# Patient Record
Sex: Female | Born: 1935 | Race: White | Hispanic: No | Marital: Single | Smoking: Never smoker
Health system: Southern US, Community
[De-identification: ages and names within clinical notes are randomized; demographics above are authoritative.]

## PROBLEM LIST (undated history)

## (undated) DIAGNOSIS — I10 Essential (primary) hypertension: Secondary | ICD-10-CM

## (undated) DIAGNOSIS — G249 Dystonia, unspecified: Secondary | ICD-10-CM

## (undated) DIAGNOSIS — J189 Pneumonia, unspecified organism: Secondary | ICD-10-CM

---

## 1998-04-28 ENCOUNTER — Inpatient Hospital Stay (HOSPITAL_COMMUNITY): Admission: AD | Admit: 1998-04-28 | Discharge: 1998-05-08 | Payer: Self-pay | Admitting: *Deleted

## 1999-02-04 ENCOUNTER — Inpatient Hospital Stay (HOSPITAL_COMMUNITY): Admission: AD | Admit: 1999-02-04 | Discharge: 1999-02-09 | Payer: Self-pay | Admitting: Psychiatry

## 1999-03-04 ENCOUNTER — Inpatient Hospital Stay (HOSPITAL_COMMUNITY): Admission: AD | Admit: 1999-03-04 | Discharge: 1999-03-24 | Payer: Self-pay | Admitting: *Deleted

## 1999-07-16 ENCOUNTER — Other Ambulatory Visit: Admission: RE | Admit: 1999-07-16 | Discharge: 1999-07-16 | Payer: Self-pay | Admitting: Internal Medicine

## 1999-08-20 ENCOUNTER — Other Ambulatory Visit: Admission: RE | Admit: 1999-08-20 | Discharge: 1999-08-20 | Payer: Self-pay | Admitting: Obstetrics and Gynecology

## 1999-08-20 ENCOUNTER — Encounter (INDEPENDENT_AMBULATORY_CARE_PROVIDER_SITE_OTHER): Payer: Self-pay

## 1999-09-29 ENCOUNTER — Emergency Department (HOSPITAL_COMMUNITY): Admission: EM | Admit: 1999-09-29 | Discharge: 1999-09-30 | Payer: Self-pay

## 2000-07-31 ENCOUNTER — Inpatient Hospital Stay (HOSPITAL_COMMUNITY): Admission: EM | Admit: 2000-07-31 | Discharge: 2000-08-10 | Payer: Self-pay | Admitting: *Deleted

## 2001-07-11 ENCOUNTER — Emergency Department (HOSPITAL_COMMUNITY): Admission: EM | Admit: 2001-07-11 | Discharge: 2001-07-11 | Payer: Self-pay | Admitting: Emergency Medicine

## 2001-07-12 ENCOUNTER — Other Ambulatory Visit: Admission: RE | Admit: 2001-07-12 | Discharge: 2001-07-12 | Payer: Self-pay | Admitting: Obstetrics and Gynecology

## 2001-07-13 ENCOUNTER — Encounter: Payer: Self-pay | Admitting: Internal Medicine

## 2001-07-13 ENCOUNTER — Ambulatory Visit (HOSPITAL_COMMUNITY): Admission: RE | Admit: 2001-07-13 | Discharge: 2001-07-13 | Payer: Self-pay | Admitting: Internal Medicine

## 2001-07-27 ENCOUNTER — Ambulatory Visit (HOSPITAL_COMMUNITY): Admission: RE | Admit: 2001-07-27 | Discharge: 2001-07-27 | Payer: Self-pay | Admitting: Obstetrics and Gynecology

## 2001-07-27 ENCOUNTER — Encounter: Payer: Self-pay | Admitting: Obstetrics and Gynecology

## 2001-08-14 ENCOUNTER — Ambulatory Visit (HOSPITAL_COMMUNITY): Admission: RE | Admit: 2001-08-14 | Discharge: 2001-08-14 | Payer: Self-pay | Admitting: Obstetrics and Gynecology

## 2001-08-14 ENCOUNTER — Encounter (INDEPENDENT_AMBULATORY_CARE_PROVIDER_SITE_OTHER): Payer: Self-pay

## 2002-01-02 ENCOUNTER — Encounter (INDEPENDENT_AMBULATORY_CARE_PROVIDER_SITE_OTHER): Payer: Self-pay

## 2002-01-02 ENCOUNTER — Encounter (INDEPENDENT_AMBULATORY_CARE_PROVIDER_SITE_OTHER): Payer: Self-pay | Admitting: *Deleted

## 2002-01-02 ENCOUNTER — Inpatient Hospital Stay (HOSPITAL_COMMUNITY): Admission: RE | Admit: 2002-01-02 | Discharge: 2002-01-07 | Payer: Self-pay | Admitting: Obstetrics and Gynecology

## 2002-01-04 ENCOUNTER — Encounter: Payer: Self-pay | Admitting: Obstetrics and Gynecology

## 2002-01-21 ENCOUNTER — Encounter: Payer: Self-pay | Admitting: Internal Medicine

## 2002-01-22 ENCOUNTER — Ambulatory Visit (HOSPITAL_COMMUNITY): Admission: RE | Admit: 2002-01-22 | Discharge: 2002-01-22 | Payer: Self-pay | Admitting: Obstetrics and Gynecology

## 2002-01-22 ENCOUNTER — Encounter: Payer: Self-pay | Admitting: Obstetrics and Gynecology

## 2002-05-23 ENCOUNTER — Encounter: Payer: Self-pay | Admitting: Emergency Medicine

## 2002-05-24 ENCOUNTER — Inpatient Hospital Stay (HOSPITAL_COMMUNITY): Admission: EM | Admit: 2002-05-24 | Discharge: 2002-06-04 | Payer: Self-pay | Admitting: Emergency Medicine

## 2002-05-25 ENCOUNTER — Encounter: Payer: Self-pay | Admitting: Pediatrics

## 2002-05-29 ENCOUNTER — Encounter: Payer: Self-pay | Admitting: Internal Medicine

## 2002-05-30 ENCOUNTER — Encounter: Payer: Self-pay | Admitting: Hematology & Oncology

## 2004-01-26 ENCOUNTER — Ambulatory Visit: Payer: Self-pay | Admitting: Internal Medicine

## 2004-01-29 ENCOUNTER — Ambulatory Visit: Payer: Self-pay | Admitting: Hematology & Oncology

## 2004-01-29 ENCOUNTER — Ambulatory Visit: Payer: Self-pay | Admitting: Gastroenterology

## 2004-01-30 ENCOUNTER — Encounter: Admission: RE | Admit: 2004-01-30 | Discharge: 2004-01-30 | Payer: Self-pay | Admitting: Gastroenterology

## 2004-02-09 ENCOUNTER — Encounter: Payer: Self-pay | Admitting: Internal Medicine

## 2004-02-09 ENCOUNTER — Ambulatory Visit (HOSPITAL_COMMUNITY): Admission: RE | Admit: 2004-02-09 | Discharge: 2004-02-09 | Payer: Self-pay | Admitting: Gastroenterology

## 2004-02-09 ENCOUNTER — Ambulatory Visit: Payer: Self-pay | Admitting: Gastroenterology

## 2004-02-09 ENCOUNTER — Encounter (INDEPENDENT_AMBULATORY_CARE_PROVIDER_SITE_OTHER): Payer: Self-pay | Admitting: Specialist

## 2004-02-17 ENCOUNTER — Ambulatory Visit: Payer: Self-pay | Admitting: Internal Medicine

## 2004-03-23 ENCOUNTER — Ambulatory Visit: Payer: Self-pay | Admitting: Internal Medicine

## 2004-05-11 ENCOUNTER — Ambulatory Visit: Payer: Self-pay | Admitting: Internal Medicine

## 2004-05-21 ENCOUNTER — Ambulatory Visit: Payer: Self-pay

## 2004-06-03 ENCOUNTER — Ambulatory Visit: Payer: Self-pay | Admitting: Internal Medicine

## 2004-06-03 ENCOUNTER — Ambulatory Visit: Payer: Self-pay | Admitting: Hematology & Oncology

## 2004-06-13 ENCOUNTER — Ambulatory Visit: Payer: Self-pay | Admitting: Internal Medicine

## 2004-06-13 ENCOUNTER — Inpatient Hospital Stay (HOSPITAL_COMMUNITY): Admission: EM | Admit: 2004-06-13 | Discharge: 2004-06-21 | Payer: Self-pay | Admitting: Emergency Medicine

## 2004-06-13 ENCOUNTER — Ambulatory Visit: Payer: Self-pay | Admitting: Physical Medicine & Rehabilitation

## 2004-06-16 ENCOUNTER — Encounter (INDEPENDENT_AMBULATORY_CARE_PROVIDER_SITE_OTHER): Payer: Self-pay | Admitting: Specialist

## 2004-07-16 ENCOUNTER — Ambulatory Visit: Payer: Self-pay | Admitting: Internal Medicine

## 2004-09-03 ENCOUNTER — Ambulatory Visit: Payer: Self-pay | Admitting: Internal Medicine

## 2004-09-30 ENCOUNTER — Ambulatory Visit: Payer: Self-pay | Admitting: Hematology & Oncology

## 2004-10-08 ENCOUNTER — Ambulatory Visit: Payer: Self-pay | Admitting: Internal Medicine

## 2004-10-28 ENCOUNTER — Ambulatory Visit: Payer: Self-pay | Admitting: Internal Medicine

## 2004-11-12 ENCOUNTER — Encounter: Admission: RE | Admit: 2004-11-12 | Discharge: 2004-11-12 | Payer: Self-pay | Admitting: Neurology

## 2005-01-10 ENCOUNTER — Ambulatory Visit: Payer: Self-pay | Admitting: Internal Medicine

## 2005-02-07 ENCOUNTER — Encounter: Payer: Self-pay | Admitting: Internal Medicine

## 2005-04-11 ENCOUNTER — Ambulatory Visit (HOSPITAL_COMMUNITY): Admission: RE | Admit: 2005-04-11 | Discharge: 2005-04-11 | Payer: Self-pay | Admitting: Anesthesiology

## 2005-04-12 ENCOUNTER — Ambulatory Visit: Payer: Self-pay | Admitting: Internal Medicine

## 2005-04-26 ENCOUNTER — Ambulatory Visit: Payer: Self-pay | Admitting: Internal Medicine

## 2005-06-22 ENCOUNTER — Emergency Department (HOSPITAL_COMMUNITY): Admission: EM | Admit: 2005-06-22 | Discharge: 2005-06-22 | Payer: Self-pay | Admitting: *Deleted

## 2005-08-10 ENCOUNTER — Ambulatory Visit: Payer: Self-pay | Admitting: Internal Medicine

## 2005-09-12 ENCOUNTER — Ambulatory Visit: Payer: Self-pay | Admitting: Internal Medicine

## 2005-11-22 IMAGING — CT CT EXTREM UP W/O CM*R*
2 of 4 series · 6 of 14 positions shown, 7 images · IV contrast (agent unspecified)
Comparison: none

CLINICAL DATA: 73-year-old with shoulder pain; questionable fracture on plain films
 CT RIGHT SHOULDER WITHOUT CONTRAST:
 High resolution thin slice helical CT was performed in the axial plane with coronal and sagittal reformatted images.  
 I do not see a definite humeral head or neck fracture.  There is osteophytic spurring involving the humeral head which I think accounts for the plain film findings.  There is also spurring along the bicipital groove with what may be an old avulsion injury or some heterotopic bone formation near the posterior aspect of the bicipital groove.  There is also evidence of calcific tendinitis involving the rotator cuff tendons.  There is degenerative change involving the AC joint.

[Series 2: shoulder · axial · 0.47mm/px · z∈[-118,-76]mm · 2 of 99 slices shown (1 of 2)]
[im 33/99  bone]
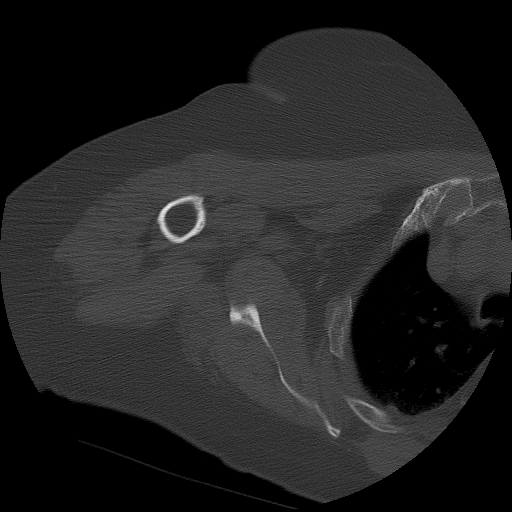
[im 66/99  bone]
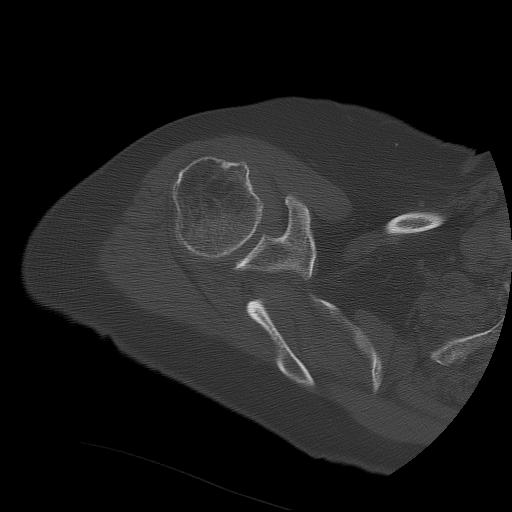

[Series 102: shoulder · axial · 0.47mm/px · z∈[-133,-60]mm · 4 of 205 slices shown, 5 images (2 of 2)]
[im 41/205  soft-tissue]
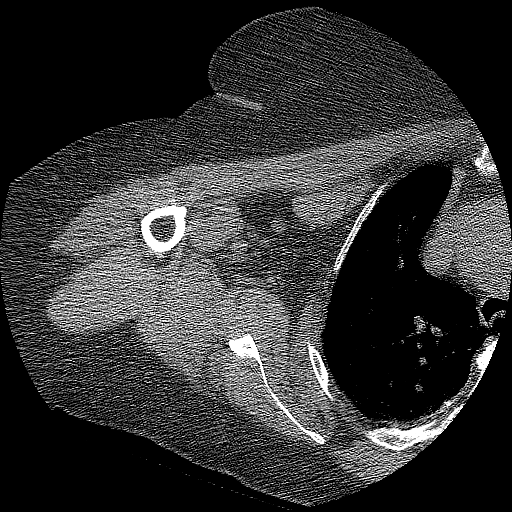
[im 41/205  bone]
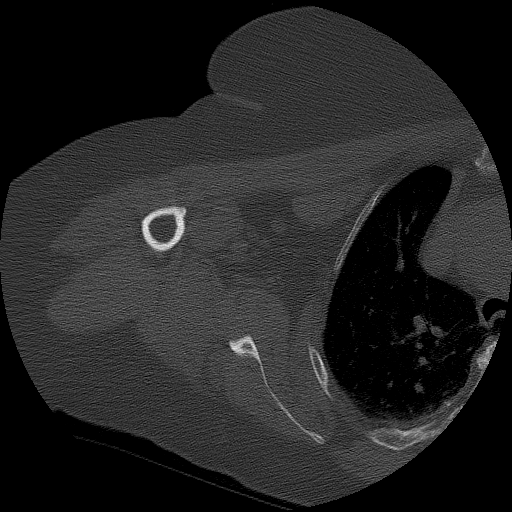
[im 82/205  bone]
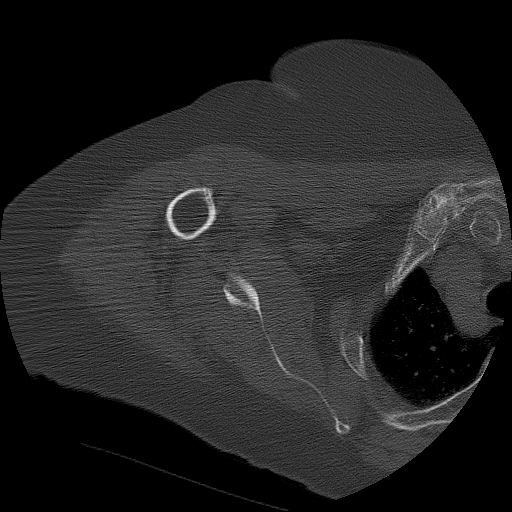
[im 123/205  bone]
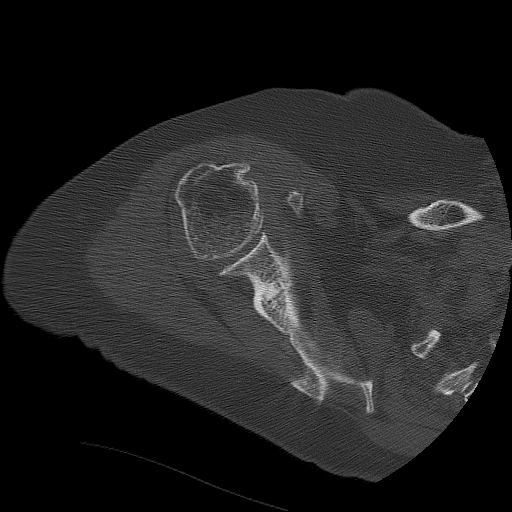
[im 164/205  bone]
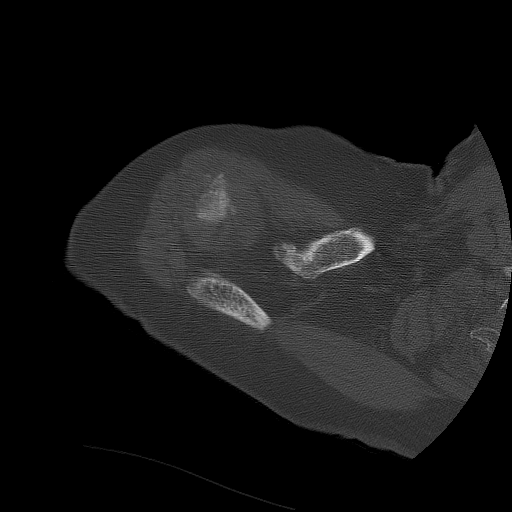

[6 of 14 positions shown; findings below may reference images not displayed]

IMPRESSION: 1.  No CT findings to suggest a humeral head or neck fracture.  
 2.  Glenohumeral joint degenerative changes.  There is also evidence for calcific tendinitis.

## 2006-02-12 ENCOUNTER — Inpatient Hospital Stay (HOSPITAL_COMMUNITY): Admission: EM | Admit: 2006-02-12 | Discharge: 2006-02-15 | Payer: Self-pay | Admitting: Emergency Medicine

## 2006-02-13 ENCOUNTER — Ambulatory Visit: Payer: Self-pay | Admitting: Internal Medicine

## 2006-03-31 ENCOUNTER — Ambulatory Visit: Payer: Self-pay | Admitting: Internal Medicine

## 2006-05-03 ENCOUNTER — Ambulatory Visit: Payer: Self-pay | Admitting: Internal Medicine

## 2006-05-03 LAB — CONVERTED CEMR LAB
ALT: 20 units/L (ref 0–40)
AST: 23 units/L (ref 0–37)
Albumin: 3.6 g/dL (ref 3.5–5.2)
Alkaline Phosphatase: 106 units/L (ref 39–117)
BUN: 16 mg/dL (ref 6–23)
Bilirubin, Direct: 0.1 mg/dL (ref 0.0–0.3)
CO2: 28 meq/L (ref 19–32)
Calcium: 9.1 mg/dL (ref 8.4–10.5)
Chloride: 104 meq/L (ref 96–112)
Creatinine, Ser: 0.6 mg/dL (ref 0.4–1.2)
GFR calc Af Amer: 126 mL/min
GFR calc non Af Amer: 104 mL/min
Glucose, Bld: 77 mg/dL (ref 70–99)
Potassium: 4.3 meq/L (ref 3.5–5.1)
Sodium: 140 meq/L (ref 135–145)
Total Bilirubin: 0.7 mg/dL (ref 0.3–1.2)
Total Protein: 8.5 g/dL — ABNORMAL HIGH (ref 6.0–8.3)

## 2006-05-04 ENCOUNTER — Encounter: Payer: Self-pay | Admitting: Internal Medicine

## 2006-05-04 LAB — CONVERTED CEMR LAB
IgA: 448 mg/dL — ABNORMAL HIGH (ref 68–378)
IgG (Immunoglobin G), Serum: 3280 mg/dL — ABNORMAL HIGH (ref 694–1618)
IgM, Serum: 89 mg/dL (ref 60–263)
Total Protein, Serum Electrophoresis: 9 g/dL — ABNORMAL HIGH (ref 6.0–8.3)

## 2006-05-17 ENCOUNTER — Ambulatory Visit: Payer: Self-pay | Admitting: Internal Medicine

## 2006-05-24 ENCOUNTER — Ambulatory Visit: Payer: Self-pay | Admitting: Internal Medicine

## 2006-05-26 ENCOUNTER — Ambulatory Visit: Payer: Self-pay | Admitting: Internal Medicine

## 2006-09-21 DIAGNOSIS — I10 Essential (primary) hypertension: Secondary | ICD-10-CM | POA: Insufficient documentation

## 2006-09-26 ENCOUNTER — Inpatient Hospital Stay (HOSPITAL_COMMUNITY): Admission: EM | Admit: 2006-09-26 | Discharge: 2006-10-02 | Payer: Self-pay | Admitting: Emergency Medicine

## 2006-09-27 ENCOUNTER — Ambulatory Visit: Payer: Self-pay | Admitting: Internal Medicine

## 2006-09-30 ENCOUNTER — Encounter: Payer: Self-pay | Admitting: Internal Medicine

## 2006-09-30 ENCOUNTER — Ambulatory Visit: Payer: Self-pay | Admitting: Vascular Surgery

## 2006-11-03 ENCOUNTER — Ambulatory Visit: Payer: Self-pay | Admitting: Internal Medicine

## 2006-11-03 DIAGNOSIS — R319 Hematuria, unspecified: Secondary | ICD-10-CM | POA: Insufficient documentation

## 2006-11-03 DIAGNOSIS — D472 Monoclonal gammopathy: Secondary | ICD-10-CM | POA: Insufficient documentation

## 2006-11-03 DIAGNOSIS — IMO0002 Reserved for concepts with insufficient information to code with codable children: Secondary | ICD-10-CM

## 2006-11-03 DIAGNOSIS — R259 Unspecified abnormal involuntary movements: Secondary | ICD-10-CM | POA: Insufficient documentation

## 2007-01-29 ENCOUNTER — Telehealth: Payer: Self-pay | Admitting: Internal Medicine

## 2007-02-02 ENCOUNTER — Ambulatory Visit: Payer: Self-pay | Admitting: Internal Medicine

## 2007-02-16 ENCOUNTER — Emergency Department (HOSPITAL_COMMUNITY): Admission: EM | Admit: 2007-02-16 | Discharge: 2007-02-16 | Payer: Self-pay | Admitting: Emergency Medicine

## 2007-03-07 ENCOUNTER — Ambulatory Visit: Payer: Self-pay | Admitting: Internal Medicine

## 2007-03-25 ENCOUNTER — Ambulatory Visit: Payer: Self-pay | Admitting: Internal Medicine

## 2007-04-04 ENCOUNTER — Telehealth: Payer: Self-pay | Admitting: Internal Medicine

## 2007-04-06 ENCOUNTER — Ambulatory Visit: Payer: Self-pay | Admitting: Internal Medicine

## 2007-04-06 LAB — CONVERTED CEMR LAB
Nitrite: POSITIVE
pH: 5.5

## 2007-04-19 ENCOUNTER — Telehealth (INDEPENDENT_AMBULATORY_CARE_PROVIDER_SITE_OTHER): Payer: Self-pay | Admitting: *Deleted

## 2007-04-22 ENCOUNTER — Encounter: Payer: Self-pay | Admitting: Internal Medicine

## 2007-04-30 ENCOUNTER — Inpatient Hospital Stay (HOSPITAL_COMMUNITY): Admission: EM | Admit: 2007-04-30 | Discharge: 2007-05-03 | Payer: Self-pay | Admitting: Emergency Medicine

## 2007-04-30 ENCOUNTER — Ambulatory Visit: Payer: Self-pay | Admitting: Cardiovascular Disease

## 2007-05-01 ENCOUNTER — Ambulatory Visit: Payer: Self-pay | Admitting: Internal Medicine

## 2007-05-02 ENCOUNTER — Encounter: Payer: Self-pay | Admitting: Internal Medicine

## 2007-05-03 ENCOUNTER — Encounter: Payer: Self-pay | Admitting: Internal Medicine

## 2007-06-28 ENCOUNTER — Encounter: Payer: Self-pay | Admitting: Internal Medicine

## 2007-07-10 ENCOUNTER — Ambulatory Visit: Payer: Self-pay | Admitting: Internal Medicine

## 2007-08-02 ENCOUNTER — Emergency Department (HOSPITAL_COMMUNITY): Admission: EM | Admit: 2007-08-02 | Discharge: 2007-08-03 | Payer: Self-pay | Admitting: Emergency Medicine

## 2007-08-02 ENCOUNTER — Encounter: Payer: Self-pay | Admitting: Internal Medicine

## 2007-08-10 ENCOUNTER — Encounter: Payer: Self-pay | Admitting: Internal Medicine

## 2007-08-22 ENCOUNTER — Telehealth: Payer: Self-pay | Admitting: Internal Medicine

## 2007-08-28 ENCOUNTER — Emergency Department (HOSPITAL_COMMUNITY): Admission: EM | Admit: 2007-08-28 | Discharge: 2007-08-28 | Payer: Self-pay | Admitting: Emergency Medicine

## 2007-09-10 ENCOUNTER — Ambulatory Visit: Payer: Self-pay | Admitting: Internal Medicine

## 2007-09-12 ENCOUNTER — Telehealth: Payer: Self-pay | Admitting: *Deleted

## 2007-09-12 LAB — CONVERTED CEMR LAB
AST: 21 units/L (ref 0–37)
Alkaline Phosphatase: 101 units/L (ref 39–117)
BUN: 10 mg/dL (ref 6–23)
Basophils Absolute: 0 10*3/uL (ref 0.0–0.1)
Bilirubin, Direct: 0.1 mg/dL (ref 0.0–0.3)
Blood in Urine, dipstick: NEGATIVE
Chloride: 105 meq/L (ref 96–112)
Eosinophils Absolute: 0.1 10*3/uL (ref 0.0–0.7)
Eosinophils Relative: 1.8 % (ref 0.0–5.0)
GFR calc non Af Amer: 75 mL/min
HDL: 38.7 mg/dL — ABNORMAL LOW (ref >39.0)
Ketones, urine, test strip: NEGATIVE
MCV: 98.1 fL (ref 78.0–100.0)
Neutrophils Relative %: 53.2 % (ref 43.0–77.0)
Nitrite: POSITIVE
Platelets: 254 10*3/uL (ref 150–400)
Potassium: 3.8 meq/L (ref 3.5–5.1)
Protein, U semiquant: NEGATIVE
Sodium: 139 meq/L (ref 135–145)
Total Bilirubin: 0.8 mg/dL (ref 0.3–1.2)
Total CHOL/HDL Ratio: 4.7
Urobilinogen, UA: 0.2
VLDL: 21 mg/dL (ref 0–40)
WBC: 4.6 10*3/uL (ref 4.5–10.5)

## 2007-10-19 ENCOUNTER — Encounter: Payer: Self-pay | Admitting: Internal Medicine

## 2007-10-22 ENCOUNTER — Telehealth: Payer: Self-pay | Admitting: Internal Medicine

## 2007-10-31 ENCOUNTER — Telehealth: Payer: Self-pay | Admitting: Internal Medicine

## 2007-11-09 ENCOUNTER — Telehealth: Payer: Self-pay | Admitting: Internal Medicine

## 2007-11-13 ENCOUNTER — Telehealth: Payer: Self-pay | Admitting: Internal Medicine

## 2007-11-22 ENCOUNTER — Encounter: Payer: Self-pay | Admitting: Internal Medicine

## 2007-11-28 ENCOUNTER — Telehealth: Payer: Self-pay | Admitting: Internal Medicine

## 2007-11-29 ENCOUNTER — Encounter: Payer: Self-pay | Admitting: Internal Medicine

## 2007-12-04 ENCOUNTER — Telehealth: Payer: Self-pay | Admitting: Internal Medicine

## 2007-12-04 ENCOUNTER — Encounter: Payer: Self-pay | Admitting: Internal Medicine

## 2007-12-05 ENCOUNTER — Emergency Department (HOSPITAL_COMMUNITY): Admission: EM | Admit: 2007-12-05 | Discharge: 2007-12-06 | Payer: Self-pay | Admitting: Emergency Medicine

## 2007-12-13 ENCOUNTER — Telehealth: Payer: Self-pay | Admitting: Internal Medicine

## 2008-01-25 ENCOUNTER — Encounter: Payer: Self-pay | Admitting: Internal Medicine

## 2008-01-28 ENCOUNTER — Encounter: Payer: Self-pay | Admitting: Internal Medicine

## 2008-02-01 ENCOUNTER — Encounter: Payer: Self-pay | Admitting: Internal Medicine

## 2008-02-04 ENCOUNTER — Telehealth: Payer: Self-pay | Admitting: Internal Medicine

## 2008-02-18 ENCOUNTER — Encounter: Payer: Self-pay | Admitting: Internal Medicine

## 2008-02-21 ENCOUNTER — Ambulatory Visit: Payer: Self-pay | Admitting: Internal Medicine

## 2008-03-11 ENCOUNTER — Ambulatory Visit: Payer: Self-pay | Admitting: Internal Medicine

## 2008-03-13 LAB — CONVERTED CEMR LAB
BUN: 16 mg/dL (ref 6–23)
Chloride: 104 meq/L (ref 96–112)
Glucose, Bld: 97 mg/dL (ref 70–99)
Potassium: 4 meq/L (ref 3.5–5.1)
Sodium: 138 meq/L (ref 135–145)

## 2008-03-17 ENCOUNTER — Encounter: Payer: Self-pay | Admitting: Internal Medicine

## 2008-04-11 ENCOUNTER — Encounter: Payer: Self-pay | Admitting: Internal Medicine

## 2008-04-17 ENCOUNTER — Encounter: Payer: Self-pay | Admitting: Internal Medicine

## 2008-05-18 ENCOUNTER — Inpatient Hospital Stay (HOSPITAL_COMMUNITY): Admission: EM | Admit: 2008-05-18 | Discharge: 2008-05-23 | Payer: Self-pay | Admitting: Emergency Medicine

## 2008-05-18 ENCOUNTER — Ambulatory Visit: Payer: Self-pay | Admitting: Internal Medicine

## 2008-05-18 ENCOUNTER — Encounter: Payer: Self-pay | Admitting: Internal Medicine

## 2008-07-09 ENCOUNTER — Ambulatory Visit: Payer: Self-pay | Admitting: Internal Medicine

## 2008-07-10 ENCOUNTER — Ambulatory Visit: Payer: Self-pay | Admitting: Internal Medicine

## 2008-07-10 DIAGNOSIS — M549 Dorsalgia, unspecified: Secondary | ICD-10-CM | POA: Insufficient documentation

## 2008-07-11 ENCOUNTER — Telehealth (INDEPENDENT_AMBULATORY_CARE_PROVIDER_SITE_OTHER): Payer: Self-pay | Admitting: *Deleted

## 2008-07-16 ENCOUNTER — Encounter: Admission: RE | Admit: 2008-07-16 | Discharge: 2008-07-16 | Payer: Self-pay | Admitting: Internal Medicine

## 2008-07-30 ENCOUNTER — Encounter: Payer: Self-pay | Admitting: Internal Medicine

## 2008-08-05 ENCOUNTER — Inpatient Hospital Stay (HOSPITAL_COMMUNITY): Admission: EM | Admit: 2008-08-05 | Discharge: 2008-08-12 | Payer: Self-pay | Admitting: Emergency Medicine

## 2008-08-05 ENCOUNTER — Ambulatory Visit: Payer: Self-pay | Admitting: Internal Medicine

## 2008-08-07 ENCOUNTER — Ambulatory Visit: Payer: Self-pay | Admitting: Physical Medicine & Rehabilitation

## 2008-09-07 ENCOUNTER — Ambulatory Visit: Payer: Self-pay | Admitting: Internal Medicine

## 2008-09-15 ENCOUNTER — Encounter: Payer: Self-pay | Admitting: Internal Medicine

## 2008-09-17 ENCOUNTER — Encounter: Payer: Self-pay | Admitting: Internal Medicine

## 2008-09-23 ENCOUNTER — Encounter: Payer: Self-pay | Admitting: Internal Medicine

## 2008-09-26 ENCOUNTER — Encounter: Payer: Self-pay | Admitting: Internal Medicine

## 2008-10-03 ENCOUNTER — Ambulatory Visit: Payer: Self-pay | Admitting: Internal Medicine

## 2008-10-15 ENCOUNTER — Emergency Department (HOSPITAL_COMMUNITY): Admission: EM | Admit: 2008-10-15 | Discharge: 2008-10-16 | Payer: Self-pay | Admitting: Emergency Medicine

## 2008-12-26 ENCOUNTER — Encounter (INDEPENDENT_AMBULATORY_CARE_PROVIDER_SITE_OTHER): Payer: Self-pay | Admitting: *Deleted

## 2009-04-04 ENCOUNTER — Encounter: Admission: RE | Admit: 2009-04-04 | Discharge: 2009-04-04 | Payer: Self-pay | Admitting: Internal Medicine

## 2009-08-06 ENCOUNTER — Encounter: Payer: Self-pay | Admitting: Internal Medicine

## 2009-09-01 ENCOUNTER — Telehealth: Payer: Self-pay | Admitting: Gastroenterology

## 2009-10-28 ENCOUNTER — Encounter: Payer: Self-pay | Admitting: Internal Medicine

## 2009-10-31 ENCOUNTER — Encounter: Payer: Self-pay | Admitting: Gastroenterology

## 2009-11-26 ENCOUNTER — Encounter: Payer: Self-pay | Admitting: Gastroenterology

## 2010-01-18 ENCOUNTER — Encounter (INDEPENDENT_AMBULATORY_CARE_PROVIDER_SITE_OTHER): Payer: Self-pay | Admitting: *Deleted

## 2010-03-04 ENCOUNTER — Ambulatory Visit: Payer: Self-pay | Admitting: Gastroenterology

## 2010-03-04 DIAGNOSIS — Z860100 Personal history of colon polyps, unspecified: Secondary | ICD-10-CM | POA: Insufficient documentation

## 2010-03-04 DIAGNOSIS — R142 Eructation: Secondary | ICD-10-CM

## 2010-03-04 DIAGNOSIS — R141 Gas pain: Secondary | ICD-10-CM | POA: Insufficient documentation

## 2010-03-04 DIAGNOSIS — R143 Flatulence: Secondary | ICD-10-CM

## 2010-04-20 NOTE — Progress Notes (Signed)
Summary: Schedule Colonoscopy   Phone Note Outgoing Call Call back at [facility] Phone [phone]   Call placed by: Harlow Mares CMA Duncan Dull),  September 01, 2009 9:47 AM Call placed to: Patient Summary of Call: No Answer, pt is due for colonoscopy.  Initial call taken by: Harlow Mares CMA Duncan Dull),  September 01, 2009 9:47 AM  Follow-up for Phone Call        Left a message on the patient machine to call back and schedule a previsit and procedure with our office. A letter will be mailed to the patient.   Follow-up by: Harlow Mares CMA Duncan Dull),  September 17, 2009 11:30 AM     Appended Document: Schedule Colonoscopy Coy Saunas, NP @ [facility] returned your call. Pt is in a nursing facility and does not think she could tolerate the prep for a colonoscopy.  # [id number]  I called Margo, NP back and explained that the patiecnts colonoscopy was a recall colonoscopy for hx of ADENOMATOUS POLYP(S, she states that since the colonoscopy was for a recall the patient needs to have the colonoscopy done she was under the impression from the pt that the colonoscopy was just a sceening, not a recall. She states that the pt is getting over pneumonia at this time but she would like for me to call her back in one month and see how she is doing to schedule the colonoscopy. She advised me the patients POA is her son Evangaline Jou, and she will let him know what is going on with his mother. I told her that when the pt is well we will schedule her for an office visit with Dr. Russella Dar since her medical situation has changed since he last seen her, but her POA must come with her to her office visit and any other visits with our office. She will advise the family and I will call them back in a little over a month to make arrangements for an office visit.   Appended Document: Schedule Colonoscopy     Phone Note Outgoing Call Call back at Home Phone [phone]   Call placed by: Harlow Mares CMA Duncan Dull),  November 06, 2009 11:08  AM Summary of Call: spoke to [place], NP she states that the patient is in better health and can have her colonoscopy now I can call the patient directly she always has a family member come with her to all of her office visits. I have left a message for the patient to call me back.  Initial call taken by: Harlow Mares CMA Duncan Dull),  November 06, 2009 11:16 AM  Follow-up for Phone Call        Spoke to the patient and Anson Fret at [facility] and i have scheduled the patient for an office visit on 01/12/2010, they will arrange transport, and the patient is her own POA but they advise me that the patients family member always comes with her to her appts.  Follow-up by: Harlow Mares CMA Duncan Dull),  November 09, 2009 10:49 AM

## 2010-04-20 NOTE — Letter (Signed)
Summary: [facility]  [facility]   Imported By: Maryln Gottron 08/11/2009 15:20:22  _____________________________________________________________________  External Attachment:    Type:   Image     Comment:   External Document

## 2010-04-20 NOTE — Assessment & Plan Note (Signed)
Summary: Gastroenterology  [facility] MR#:  [id number] Page #  NAME:  [name], [name]  OFFICE NO:  [id number]  DATE:  01/29/04  DOB:  10/05/2030  REFERRING PHYSICIAN:  Valetta Mole. Swords, M.D.   REASON FOR REFERRAL:  Right lower quadrant abdominal pain.  HISTORY OF PRESENT ILLNESS:  The patient is a 77 year old white female with multiple medical problems, as outlined below. She relates some vague difficulties with mild right lower quadrant abdominal pain that is brought on when she changes position at night. She sometimes cannot tolerate lying on her right side. In addition, she notes a substantial weight gain over the past 2 years of an unspecified amount. She notes no melena, hematochezia, change in stool caliber, nausea, vomiting, hematemesis, or rectal pain. There is no family history of colon cancer, colon polyps, or inflammatory bowel disease. She has not previously had colonoscopy, and she is referred with a specific request for surveillance colonoscopy. She denies problems with constipation. However, she has had abdominal films from March 2004 that indicate at least intermittent difficulties with severe constipation.   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  ALLERGIES:  None known.  MEDICATIONS:  Listed on the chart, updated and reviewed.  SOCIAL HISTORY:  She is divorced with 4 children. She lives alone, and she is disabled. She denies alcohol or tobacco product usage.  REVIEW OF SYSTEMS:  As per the handwritten form.  PHYSICAL EXAMINATION:  An overweight white female in no acute distress. She  is somewhat difficult to understand due to her dystonia with dysarthria. Height 5 feet 2 inches. Weight 208 pounds. Blood pressure is 124/80. Pulse is 74 and regular. HEENT exam:  Anicteric sclerae; oropharynx clear. Neck with decreased range of motion. No thyromegaly or adenopathy appreciated.  Cardiac:  Regular rate and rhythm without murmurs. Chest:  Clear to auscultation and percussion.  Abdomen is soft, protuberant with normoactive bowel sounds; no palpable organomegaly, masses or hernias.  Extremities:  Without clubbing, cyanosis, or edema.  Neurologic: Awake, dysarthric, difficult to understand. Appears to be fully oriented. Rectal examination deferred to time of colonoscopy.   ASSESSMENT AND PLAN:  Unexplained right lower quadrant abdominal pain. It does have features of a musculoskeletal process. Need to rule out intraabdominal pathology. We will schedule an abdominal and pelvic CT scan. In addition, she is due for colorectal cancer screening. Risks, benefits, and alternatives to colonoscopy with possible biopsy and possible polypectomy discussed with the patient, and she consents to proceed. She is advised to use an extended bowel preparation, given her history of constipation.       Venita Lick. Russella Dar, M.D., F.A.C.G.  [id number] cc:  Valetta Mole. Swords, M.D.  D:  01/29/04; T:  ; Job [phone]

## 2010-04-20 NOTE — Letter (Signed)
Summary: New Patient letter  [facility]  [address] [place], [place] [address]   Phone: [phone]  Fax: [phone]       01/18/2010 MRN: [id number]  [facility] [address] [place], [place]  [address]  Dear Ms. Ladona Ridgel,  Welcome to the Gastroenterology Division at [facility].    You are scheduled to see Dr. Russella Dar on 03/04/2010 at 11:00AM on the 3rd floor at [facility], [address].  We ask that you try to arrive at our office 15 minutes prior to your appointment time to allow for check-in.  We would like you to complete the enclosed self-administered evaluation form prior to your visit and bring it with you on the day of your appointment.  We will review it with you.  Also, please bring a complete list of all your medications or, if you prefer, bring the medication bottles and we will list them.  Please bring your insurance card so that we may make a copy of it.  If your insurance requires a referral to see a specialist, please bring your referral form from your primary care physician.  Co-payments are due at the time of your visit and may be paid by cash, check or credit card.     Your office visit will consist of a consult with your physician (includes a physical exam), any laboratory testing he/she may order, scheduling of any necessary diagnostic testing (e.g. x-ray, ultrasound, CT-scan), and scheduling of a procedure (e.g. Endoscopy, Colonoscopy) if required.  Please allow enough time on your schedule to allow for any/all of these possibilities.    If you cannot keep your appointment, please call [phone] to cancel or reschedule prior to your appointment date.  This allows [place] the opportunity to schedule an appointment for another patient in need of care.  If you do not cancel or reschedule by 5 p.m. the business day prior to your appointment date, you will be charged a $50.00 late cancellation/no-show fee.    Thank you for  choosing [facility] for your medical needs.  We appreciate the opportunity to care for you.  Please visit [place] at our website  to learn more about our practice.                     Sincerely,                                                             The Gastroenterology Division

## 2010-04-20 NOTE — Miscellaneous (Signed)
Summary: Physician's Orders/[facility]  Physician's Orders/[facility]   Imported By: Maryln Gottron 10/29/2009 14:33:41  _____________________________________________________________________  External Attachment:    Type:   Image     Comment:   External Document

## 2010-04-22 NOTE — Miscellaneous (Signed)
Summary: Physician Progress Note/[facility]  Physician Progress Note/[facility]   Imported By: Sherian Rein 03/09/2010 09:48:39  _____________________________________________________________________  External Attachment:    Type:   Image     Comment:   External Document

## 2010-04-22 NOTE — Assessment & Plan Note (Signed)
Summary: DISCUSS COL (RESCH FROM CANCELLATION)...AS.   History of Present Illness Visit Type: Initial Consult Primary GI MD: Elie Goody MD [facility] Primary Provider: Jarome Matin, MD  Requesting Provider: Jarome Matin, MD  Chief Complaint: Consult colon. Pt c/o bloating, and belching  History of Present Illness:   This is a 77 year old female here today with her daughter. She has been a resident of [facility] nursing home for several years. She has chronic spastic dysphonia, with gait instability, a history of schizophrenia and depression. She has very limited mobility. She complains of frequent bloating and belching. No ongoing heartburn on omeprazole 20 mg daily. She has a history of adenomatous colon polyps from colonoscopy in November 2005.   GI Review of Systems    Reports belching and  bloating.      Denies abdominal pain, acid reflux, chest pain, dysphagia with liquids, dysphagia with solids, heartburn, loss of appetite, nausea, vomiting, vomiting blood, weight loss, and  weight gain.        Denies anal fissure, black tarry stools, change in bowel habit, constipation, diarrhea, diverticulosis, fecal incontinence, heme positive stool, hemorrhoids, irritable bowel syndrome, jaundice, light color stool, liver problems, rectal bleeding, and  rectal pain.   Current Medications (verified): 1)  Zyprexa 10 Mg Tabs (Olanzapine) .... One Tablet By Mouth At Bedtime 2)  Divalproex Sodium 125 Mg Cpsp (Divalproex Sodium) .... 2 Cap Two Times A Day 3)  Tylenol 325 Mg Tabs (Acetaminophen) .... 2 Every 4 Hours As Needed Temp 4)  Artificial Tears  Soln (Artificial Tear Solution) .... Once Daily 5)  Omeprazole 20 Mg Cpdr (Omeprazole) .... One Tablet By Mouth Once Daily 6)  Wellbutrin Sr 100 Mg Xr12h-Tab (Bupropion Hcl) .... One Tablet By Mouth Once Daily 7)  Lasix 20 Mg Tabs (Furosemide) .... One Tablet By Mouth Once Daily 8)  Neurontin 300 Mg Caps (Gabapentin) .... One Capsule By Mouth Three  Times A Day 9)  Senna 187 Mg Tabs (Senna) .... As Needed 10)  Vitamin D3 50000 Unit Caps (Cholecalciferol) .... One Capsule By Mouth Once A Month 11)  Lisinopril 2.5 Mg Tabs (Lisinopril) .... One Tablet By Mouth Once Daily At Bedtime 12)  Calcium 500/d 500-400 Mg-Unit Chew (Calcium Carb-Cholecalciferol) .Marland Kitchen.. 1 Tablet By Mouth Once Daily With Food 13)  Klor-Con 10 10 Meq Cr-Tabs (Potassium Chloride) .... One Tablet By Mouth Once Daily 14)  Lortab 5-500 Mg Tabs (Hydrocodone-Acetaminophen) .... One Tablet By Mouth Every Six Hours As Needed For Pain 15)  Xanax 0.5 Mg Tabs (Alprazolam) .... One Tablet By Mouth Every 8 Hours As Needed 16)  Milk of Magnesia 400 Mg/9ml Susp (Magnesium Hydroxide) .... 30cc By Mouth If No Bm in Three Days As Needed 17)  Claritin 10 Mg Tabs (Loratadine) .... As Needed 18)  Lidocaine Hcl 2 % Gel (Lidocaine Hcl) .... As Directed 19)  Albuterol Sulfate (2.5 Mg/59ml) 0.083% Nebu (Albuterol Sulfate) .... As Directed 20)  Oxygen .... As Directed 21)  Xopenex 0.63 Mg/15ml Nebu (Levalbuterol Hcl) .... As Directed  Allergies (verified): No Known Drug Allergies  Past History:  Past Medical History: Hypertension Brain sx-1972 D&C-2003 Dystonia MGUS Depression Adenomatous Colon Polyps 01/2004 Congestive Heart Failure Pneumonia Urinary Tract Infection Stroke Glaucoma Compression fracture w/ kyphoplasty at L3 and L4 Schizophrenia GERD Obesity Anxiety Disorder Arthritis  Past Surgical History: Stereotactic brain biopsy 1972 Kyphoplasty TAH Hysterectomy D&C  Family History: Father: deceased age 53, crippled Mother: deceased, unknown Siblings: 1 brother, unknown--3 sisters, one deceased, others unknown No FH of  Colon Cancer:  Social History: Retired Lives in Nursing Home  Divorced Never Smoked Alcohol use-no 4 children, healthy  Review of Systems       The patient complains of allergy/sinus, anxiety-new, arthritis/joint pain, back pain, skin rash,  sleeping problems, and voice change.         The pertinent positives and negatives are noted as above and in the HPI. All other ROS were reviewed and were negative.   Vital Signs:  Patient profile:   77 year old female Pulse rate:   92 / minute Pulse rhythm:   regular BP sitting:   126 / 74  (left arm) Cuff size:   regular  Vitals Entered By: Ok Anis CMA (March 04, 2010 11:29 AM)  Physical Exam  General:  Well developed, well nourished, no acute distress. In a wheelchair Head:  Normocephalic and atraumatic. Eyes:  PERRLA, no icterus. Mouth:  No deformity or lesions, dentition normal. Lungs:  Clear throughout to auscultation. Heart:  Regular rate and rhythm; no murmurs, rubs,  or bruits. Abdomen:  Soft, nontender and nondistended. No masses, hepatosplenomegaly or hernias noted. Normal bowel sounds. Pulses:  Normal pulses noted. Extremities:  No clubbing, cyanosis, edema or deformities noted. Neurologic:  weakness noted.   Cervical Nodes:  No significant cervical adenopathy. Inguinal Nodes:  No significant inguinal adenopathy. Psych:  poor memory.    Impression & Recommendations:  Problem # 1:  PERSONAL HX COLONIC POLYPS (ICD-V12.72) Prior history of adenomatous colon polyps in 01/2004. Due to her comorbidities, she is not a good candidate for a surveillance colonoscopy and it would be difficult for her to complete the bowel preparation process. After discussing this with the patient and her daughter all of [place] are in agreement to discontinue plans for surveillance colonoscopies.  Problem # 2:  FLATULENCE-GAS-BLOATING (ICD-787.3) Trial of a low gas diet, and Gas-X q.i.d. p.r.n. Consider increasing omeprazole to 40 mg daily. For followup to her primary physician at the nursing home.  Patient Instructions: 1)  We are cancelling colonoscopy recalls at this time. 2)  Please schedule a follow-up appointment as needed.  3)  Copy sent to : Jarome Matin, MD 4)  The  medication list was reviewed and reconciled.  All changed / newly prescribed medications were explained.  A complete medication list was provided to the patient / caregiver.

## 2010-06-29 LAB — DIFFERENTIAL
Basophils Absolute: 0.1 10*3/uL (ref 0.0–0.1)
Basophils Relative: 1 % (ref 0–1)
Eosinophils Relative: 2 % (ref 0–5)
Monocytes Absolute: 0.3 10*3/uL (ref 0.1–1.0)

## 2010-06-29 LAB — VITAMIN B12: Vitamin B-12: 416 pg/mL (ref 211–911)

## 2010-06-29 LAB — BASIC METABOLIC PANEL
BUN: 12 mg/dL (ref 6–23)
CO2: 23 mEq/L (ref 19–32)
Calcium: 9.3 mg/dL (ref 8.4–10.5)
Creatinine, Ser: 0.66 mg/dL (ref 0.4–1.2)
GFR calc Af Amer: >60 mL/min (ref >60)
GFR calc non Af Amer: >60 mL/min (ref >60)
GFR calc non Af Amer: >60 mL/min (ref >60)
Glucose, Bld: 96 mg/dL (ref 70–99)
Potassium: 3.9 mEq/L (ref 3.5–5.1)
Sodium: 139 mEq/L (ref 135–145)

## 2010-06-29 LAB — URINALYSIS, ROUTINE W REFLEX MICROSCOPIC
Bilirubin Urine: NEGATIVE
Hgb urine dipstick: NEGATIVE
Nitrite: NEGATIVE
Specific Gravity, Urine: 1.008 (ref 1.005–1.030)
pH: 7 (ref 5.0–8.0)

## 2010-06-29 LAB — MAGNESIUM: Magnesium: 2.3 mg/dL (ref 1.5–2.5)

## 2010-06-29 LAB — FOLATE RBC: RBC Folate: 904 ng/mL — ABNORMAL HIGH (ref 180–600)

## 2010-06-29 LAB — CBC
HCT: 37.4 % (ref 36.0–46.0)
Hemoglobin: 12.6 g/dL (ref 12.0–15.0)
Platelets: 237 10*3/uL (ref 150–400)
RBC: 3.73 MIL/uL — ABNORMAL LOW (ref 3.87–5.11)
RDW: 14.2 % (ref 11.5–15.5)
WBC: 4.9 10*3/uL (ref 4.0–10.5)

## 2010-07-01 LAB — CBC
Hemoglobin: 11.7 g/dL — ABNORMAL LOW (ref 12.0–15.0)
Hemoglobin: 12.1 g/dL (ref 12.0–15.0)
MCHC: 34.5 g/dL (ref 30.0–36.0)
MCHC: 34.6 g/dL (ref 30.0–36.0)
MCHC: 34.8 g/dL (ref 30.0–36.0)
MCV: 94.5 fL (ref 78.0–100.0)
Platelets: 176 10*3/uL (ref 150–400)
RBC: 3.75 MIL/uL — ABNORMAL LOW (ref 3.87–5.11)
RDW: 13.5 % (ref 11.5–15.5)
RDW: 13.6 % (ref 11.5–15.5)
RDW: 14.2 % (ref 11.5–15.5)

## 2010-07-01 LAB — BASIC METABOLIC PANEL
CO2: 31 mEq/L (ref 19–32)
Calcium: 8.7 mg/dL (ref 8.4–10.5)
Chloride: 102 mEq/L (ref 96–112)
Glucose, Bld: 118 mg/dL — ABNORMAL HIGH (ref 70–99)
Sodium: 140 mEq/L (ref 135–145)

## 2010-07-01 LAB — LEGIONELLA ANTIGEN, URINE

## 2010-07-06 LAB — COMPREHENSIVE METABOLIC PANEL
ALT: 18 U/L (ref 0–35)
AST: 25 U/L (ref 0–37)
Albumin: 2.9 g/dL — ABNORMAL LOW (ref 3.5–5.2)
CO2: 25 mEq/L (ref 19–32)
Chloride: 102 mEq/L (ref 96–112)
GFR calc Af Amer: >60 mL/min (ref >60)
GFR calc non Af Amer: >60 mL/min (ref >60)
Potassium: 3.8 mEq/L (ref 3.5–5.1)
Sodium: 134 mEq/L — ABNORMAL LOW (ref 135–145)
Total Bilirubin: 0.5 mg/dL (ref 0.3–1.2)

## 2010-07-06 LAB — POCT I-STAT 3, ART BLOOD GAS (G3+)
Acid-Base Excess: 2 mmol/L (ref 0.0–2.0)
Bicarbonate: 25.7 mEq/L — ABNORMAL HIGH (ref 20.0–24.0)
Patient temperature: 98.6
TCO2: 27 mmol/L (ref 0–100)
pH, Arterial: 7.457 — ABNORMAL HIGH (ref 7.350–7.400)

## 2010-07-06 LAB — URINALYSIS, ROUTINE W REFLEX MICROSCOPIC
Bilirubin Urine: NEGATIVE
Ketones, ur: NEGATIVE mg/dL
Leukocytes, UA: NEGATIVE
Nitrite: POSITIVE — AB
Protein, ur: NEGATIVE mg/dL
Urobilinogen, UA: 0.2 mg/dL (ref 0.0–1.0)
pH: 5.5 (ref 5.0–8.0)

## 2010-07-06 LAB — URINE CULTURE: Colony Count: >=100000

## 2010-07-06 LAB — CBC
MCV: 95.4 fL (ref 78.0–100.0)
Platelets: 165 10*3/uL (ref 150–400)
RBC: 3.8 MIL/uL — ABNORMAL LOW (ref 3.87–5.11)
WBC: 4.9 10*3/uL (ref 4.0–10.5)

## 2010-07-06 LAB — CK TOTAL AND CKMB (NOT AT ARMC)
CK, MB: 1.8 ng/mL (ref 0.3–4.0)
Relative Index: INVALID (ref 0.0–2.5)

## 2010-07-06 LAB — TROPONIN I: Troponin I: <0.01 ng/mL (ref 0.00–0.06)

## 2010-07-06 LAB — DIFFERENTIAL
Basophils Absolute: 0 10*3/uL (ref 0.0–0.1)
Eosinophils Absolute: 0 10*3/uL (ref 0.0–0.7)
Eosinophils Relative: 1 % (ref 0–5)
Monocytes Absolute: 0.3 10*3/uL (ref 0.1–1.0)

## 2010-07-06 LAB — CULTURE, BLOOD (ROUTINE X 2): Culture: NO GROWTH

## 2010-07-06 LAB — D-DIMER, QUANTITATIVE: D-Dimer, Quant: 13.23 ug/mL-FEU — ABNORMAL HIGH (ref 0.00–0.48)

## 2010-07-06 LAB — GLUCOSE, CAPILLARY: Glucose-Capillary: 171 mg/dL — ABNORMAL HIGH (ref 70–99)

## 2010-08-03 NOTE — Discharge Summary (Signed)
NAMEBRIARROSE, SHOR                ACCOUNT NO.:  [id number]   MEDICAL RECORD NO.:  [id number]          PATIENT TYPE:  INP   LOCATION:  3002                         FACILITY:  [facility]   PHYSICIAN:  Raenette Rover. Felicity Coyer, MDDATE OF BIRTH:  10-23-1930   DATE OF ADMISSION:  04/30/2007  DATE OF DISCHARGE:  05/03/2007                               DISCHARGE SUMMARY   DISCHARGE DIAGNOSES:  1. Weakness, unclear etiology, question progression of chronic      dystonia symptoms.  2. Depression.  3. History of progressive spastic dystonia.  4. Labile hypertension.  5. Mild hypokalemia on admission.  6. History of CVA.   HISTORY OF PRESENT ILLNESS:  Ms. Delano is a 77 year old white female  who was admitted on April 30, 2007, with chief complaint of 3-day  history of weakness.  She has a history of chronic debilitation and  notes onset of generalized weakness including inability to ambulate with  walker.  She also noted an episode of chest pain 2 days prior to this  admission which lasted several minutes.  She denied any focal weakness,  recent fever, chills, cough, shortness of breath, abdominal pain,  nausea, vomiting, diarrhea, dysuria, headache, blurred vision or  dizziness.  She was admitted for further evaluation and treatment.   PAST MEDICAL HISTORY:  1. CVA.  2. Depression  3. Frequent falls.  4. Hypertension.  5. Progressive spastic dystonia status post brain surgery in 1972.  6. History monoclonal gammopathy.  7. Vertebral fracture status post kyphoplasty.   COURSE OF HOSPITALIZATION:  WEAKNESS.  The patient was admitted.  She  underwent CT of the head which showed no acute infarct.  Serial cardiac  enzymes were negative.  TSH was within normal limits.  However, it was  upper limit of normal, and she elected me to have her TSH repeated as an  outpatient in 4-6 weeks.  She did have some complaints of difficulty  voiding.  Bladder scan noted no significant postvoid residual.  She  is  currently voiding without difficulty.  Blood pressures remained stable  off the antihypertensives.   PHYSICAL EXAMINATION:  VITAL SIGNS:  BP 133/83, heart rate 75,  respiratory rate 20, temperature 97.9, O2 sat 90% room air.  GENERAL: The patient is an elderly white female who is awake and alert.  No acute distress.  CARDIOVASCULAR: S1 and S2 regular rate and rhythm.  LUNGS:  Clear to auscultation bilaterally without wheezes, rales or  rhonchi.  ABDOMEN:  Soft, nontender, nondistended.  Positive bowel sounds are  noted.  NEUROLOGICAL:  The patient is awake and alert.  She has dysarthria at  baseline.  She is moving all extremities.   DISCHARGE MEDICATIONS:  1. Elavil 100 mg p.o. q.h.s.  2. Lovenox 40 mg subcu daily.  3. Ativan 1 mg p.o. q.6 h p.r.n.  4. Tylenol 650 mg p.o. q.4 h p.r.n.  5. Zofran 4 mg IV q.4 h p.r.n.   DISPOSITION:  The patient be discharged to the skilled nursing facility.   PERTINENT LABS AT TIME OF DISCHARGE:  Urine culture negative.  A 2-D  echo  performed during this admission notes normal left LV function.  BUN  12, creatinine 0.73, hemoglobin 12.5, hematocrit 36.   FOLLOW UP:  The patient will be followed up upon discharge as needed  with her primary care Idabelle Mcpeters, Dr. Birdie Sons.      Sandford Craze, NP      Raenette Rover. Felicity Coyer, MD  Electronically Signed    MO/MEDQ  D:  05/03/2007  T:  05/03/2007  Job:  [id number]   cc:   Valetta Mole. Swords, MD

## 2010-08-03 NOTE — Discharge Summary (Signed)
[name], [name]                ACCOUNT NO.:  [id number]   MEDICAL RECORD NO.:  [id number]          PATIENT TYPE:  INP   LOCATION:  5010                         FACILITY:  [facility]   PHYSICIAN:  Valerie A. Felicity Coyer, MDDATE OF BIRTH:  1930-03-31   DATE OF ADMISSION:  05/18/2008  DATE OF DISCHARGE:  05/23/2008                               DISCHARGE SUMMARY   DISCHARGE DIAGNOSES:  1. Shortness of breath due to bronchitis.  No evidence of pneumonia or      pulmonary embolism on CT angiogram.  Continue antibiotic therapy as      detailed below to complete 10-day course.  Home oxygen and      nebulizers also ordered.  2. Progressive spastic dystonia, associated with debility and      recurrent falls, for skilled nursing facility at this time.  3. Acute versus subacute L4 compression fracture.  Pain controlled      with scheduled Darvocet.  Continue therapy as tolerated.  4. Constipation.  5. History of monoclonal gammopathy of uncertain significance (MGUS)      with no evidence of progression to myeloma.  6. Klebsiella pneumonia urinary tract infection.  Antibiotic treatment      as per number 1.  7. History of hypertension.  Not on specific therapy at this time.  8. Mild hyponatremia, resolved with D5 therapy.  9. History of stereotactic brain surgery, 1972, not on anti-seizure      medications per her report.  10.History of kyphoplasty of L3, March 2006, for compression fracture      related to fall.   DISCHARGE MEDICATIONS INCLUDE:  1. DuoNebs t.i.d. times 7 days then q.4 h p.r.n. shortness of breath.  2. Ceftin 500 mg p.o. b.i.d. through March 10, which will complete 10-      day course of antibiotic therapy.  3. Prednisone 10 mg tablets, taper as described, 60 mg x 2 additional      days, then 50 mg daily x 3 days, then 40 mg daily x 3 days, then 30      mg daily x 3 days, then 20 mg daily x 3 days, then 10 mg daily x 3      days, then stop.  4. Darvocet 1 tablet p.o. t.i.d.  scheduled plus one q.4 h p.r.n.      breakthrough pain.  5. Protonix 40 mg p.o. daily.   Other medications are as prior to admission, include:   1. Elavil 100 mg p.o. q.h.s.  2. Ativan 1 mg p.o. q.i.d. scheduled.  3. Robaxin 500 mg t.i.d. scheduled.  4. MiraLax 17 grams with 8 ounces a water once daily for constipation.   The patient reports she is not taking Detrol 4 mg daily, Depakote 125 mg  b.i.d. or Zyprexa 5 mg q.h.s., as previously listed.  The patient may  also continue her ophthalmic drops, Omnipred 1 drop 4 times daily and  Pred Forte 1 drop 4 times daily.   DISPOSITION:  The patient is discharged to [facility] skilled nursing  facility for intensive rehab therapy at this time prior to return to  her  assisted living.  Hospital follow-up is with Dr. Birdie Sons, her  primary care physician, for March 16 at 12 noon.  The patient would like  to discuss other possible therapies for what appears to be progressive  spastic dystonia with increasing falls, weakness and pain syndrome, but  would like to try effort at intensive rehab as described here first.   CONDITION ON DISCHARGE:  Hemodynamically stable, still with complaint of  mild to moderate low back pain with radiation around to bilateral lower  quadrants of her abdomen, but respiratory status stable.  Presence of  pseudo-wheeze for which we have also added Protonix.   HOSPITAL COURSE BY PROBLEM:  1. Shortness of breath with bronchitis.  The patient is a 77 year old      woman with progressive spastic dystonia and came to the emergency      room due to increasing shortness of breath.  She reports she has      had increasing number of falls with pain in her bilateral thighs,      as well as her back, but acute onset of shortness of breath.  Chest      x-ray performed in the emergency room was largely unremarkable, but      she was significantly hypoxic.  She was treated with antibiotic      therapy, IV Rocephin and  azithromycin, as well as IV steroids and      Lasix, with slow improvement.  She also underwent a CT chest      angiogram to rule out PE due to elevated D-dimer and treated with      full-dose Lovenox until this result returned negative for evidence      of PE.  There was some perhaps early patchy air space disease and      atelectasis for which she was continually treated with antibiotics      and steroids as above.  We have also added Protonix for an element      of probable GERD, affecting vocal cord dysfunction with pseudo-      wheeze, and will be continued on a slow prednisone taper, 10-day      antibiotic course and DuoNebs an oxygen as described above.  She is      having no productive sputum and feels that her breathing is better      so long as her pain is controlled.  Please see problem number next.  2. Chronic pain with low back and legs.  The patient did undergo plain      x-rays of her L-spine, pelvis and hips which suggested acute,      versus subacute, moderate compression deformity at L4 as well as      stable changes at L3 from previous kyphoplasty.  There were no      other fractures identified.  She also underwent a CT abdomen,      pelvis, due to complaints of bilateral lower quadrant pain which      wrapped around from her hips and back.  This was negative for any      intra-abdominal pathology, though she has been largely obstipated      during this hospitalization, refusing her MiraLax.  Her pain      symptoms are controlled with Darvocet and the patient has      repeatedly commented during this hospitalization that she is      concerned she has cancer.  She was informed that her CT  results did      not show any evidence of cancer and offered potential kyphoplasty      for her back pain.  She says that this pain that she is      experiencing is different from her previous back pain and does not      wish to undergo kyphoplasty at this time.  Her pain symptoms are       controlled with Darvocet.  The patient is also concerned that her      leg pain is due to progressive muscle spasms, related to her      dystonia, and resulting in her increased falls and balance.  The      patient is concerned that she is nearing a time that she will need      a wheelchair and I have confirmed with her that this may be true.      Follow-up with her primary care physician on these neurologic      issues after a trial of therapy at skilled nursing facility has      been undertaken would be appropriate and the patient agrees that      she will do so.  We will continue scheduled Darvocet as well as      p.r.n., in addition to her home antispasmodic medications of Ativan      and Robaxin as prior to admission.  3. Other medical issues.  The patient's other chronic medical issues      are as listed.  She had been ordered medications including Detrol,      Depakote and Zyprexa at the time of admission, but reports she is      not taking these medications and as such we have not continued them      at this time.  The patient will call her primary care physician if      there are problems prior to her appointment, or return to the      emergency room for other acute issues in the interim.  At this      time, the patient is felt to have reached maximum benefit of acute      hospitalization and is stable for discharge to skilled nursing      facility.   PHYSICAL EXAM ON DAY OF DISCHARGE:  Temperature 97.2, blood pressure  150/96, pulse of 89, respiratory rate 18, satting 93% on 2 liters.  IN GENERAL:  She is an obese, heavy-set woman, who is largely sedentary  but cognitively intact.  NEURO:  Shows spastic movements, most pronounced in her left upper  extremity, but also her facial features, affecting her ability to speak.  She is, however, awake, alert, oriented x3 and fully cognitively intact,  despite her slow speech, with good comprehension and expression of her  self.   She will move all extremities and follow commands.  LUNG EXAM:  Shows positive pseudo-wheeze of the upper airways with  moderate air movement throughout, decreased at the bases bilaterally,  due to decreased effort with inspiration due to positioning in bed but  is largely clear without crackles, rhonchi or wheeze.  CARDIOVASCULAR EXAM:  Regular rate and rhythm.  She does have chronic 1+  lower extremity edema and BNP has been negative for evidence of heart  failure this hospitalization.  ABDOMEN:  Obese and protuberant with a nontender pannus, good bowel  sounds throughout.   Greater than 30 minutes spent on day of discharge for coordination of  discharge planning and follow-up.      Valerie A. Felicity Coyer, MD  Electronically Signed     VAL/MEDQ  D:  05/23/2008  T:  05/23/2008  Job:  [id number]

## 2010-08-03 NOTE — Consult Note (Signed)
[name], [name]                ACCOUNT NO.:  [id number]   MEDICAL RECORD NO.:  [id number]          PATIENT TYPE:  INP   LOCATION:  1540                         FACILITY:  [facility]   PHYSICIAN:  Melvyn Novas, M.D.  DATE OF BIRTH:  01-24-1931   DATE OF CONSULTATION:  08/06/2008  DATE OF DISCHARGE:                                 CONSULTATION   CHIEF COMPLAINT:  Dystonia.   HISTORY OF PRESENT ILLNESS:  [name] has been followed by [facility] for  several years, initially by Dr. Noreene Filbert, subsequently by Dr. Thad Ranger.  The patient has a history of a right facial and neck dominant dystonia,  which at time has evolved to generalized dystonic disorder,Thus she  underwent a left brain stereotactic procedure which resulted in good  relief of her right-sided symptoms, but persistent symptoms on the left  involving primarily the face, neck, to lesser extent the upper  extremity.   The patient states that this dystonia initially presented at the age of  27 in her 6 months of pregnancy and has been present since.  The patient has received botulinum toxins in the past through Dr.  Boris Sharper at [facility] and once  through [facility].  Her last injection was  on April 14, 2006, and she was seen by Dr. Thad Ranger, states she felt  no relief from that last procedure and was curious if the amount of  borox used was different form [id number]>  .  Today's consultation is secondary to patient being admitted for  muscle aches and  worsening dystonia.  The patient states over the last  2 weeks, her symptoms have worsened , primarily along her left hand,  arm, and leg.   Unfortunately, the patient has stated that having dystonia for such a  long period of time, all medications that she had tried no longer gave  her relief.  The patient has been on Klonopin, Robaxin, Benadryl, and  Valium in the past.  The patient at present time takes Ativan 1 mg  q.i.d.   The patient was seen on January 16, 2007, by Dr.  Thad Ranger.  At that  time, had an EMG obtained results of  abnormal study with  electrophysiological evidence of disorder of central motor control  consistent with diagnosis of generalized dystonia.   PAST MEDICAL AND SURGICAL HISTORY:  1. Hypertension.  2. CVA.  3. Klebsiella UTI.  4. Progressed spastic dystonia.  5. Pseudo-wheezing.  6. Depression.  7. L4 compression fracture.  8. Anxiety.  9. Kyphoplasty of L3.  10.stereotactic cerebral procedure.   MEDICATIONS:  1. Omeprazole 20 mg daily.  2. MiraLax daily.  3. Darvocet-N 100 t.i.d.  4. DuoNeb.  5. Ativan 1 mg 4 times daily.  6. Elavil 100 mg nightly.   The patient has been given orders while in the hospital for Valium 5-10  mg t.i.d.   ALLERGIES:  No known drug allergies.   FAMILY HISTORY:  Noncontributory.   SOCIAL HISTORY:  The patient does not smoke, drink, or do illicit drugs.  At this time, she resides at [facility] in assisted living .  REVIEW OF SYSTEMS:  Dystonia, torticollis, joint pain, stiffness,  decreased range of motion, and depression.   PHYSICAL EXAMINATION:  GENERAL:  The patient is alert.  She is oriented.  She carries out 2 and 3-step commands without any difficulty.  She is  conversive and appropriate.  VITAL SIGNS:  Blood pressure at this time is 116/80, pulse 94,  respiratory 20, and temperature 97 degrees.  PULMONARY:  Clear to auscultation.  CARDIOVASCULAR:  S1 and S2.  Regular rate and rhythm.  NECK:  Negative bruits.  Supple.  NEUROLOGIC:  Pupils equal, round, reactive to light.  Conjugate gaze.  Extraocular muscles are intact.  Visual fields are intact.  The patient  is lying supine.  Her neck is spastically contorted to the right.  She has increased  muscle tone over her intrascapular muscles notably on the left.  Her left arm is contracted in a flexion as is her her left wrist  .  There are no fasciculations noted.  The patient is able to relax to the  point in which tone is  decreased enough to allow a range of motion  assessment .  The patient does have very significant difficulty with speaking.  She has a unmodulated speaking voice , and has swallowing problems  secondary to contraction of her orbicularis oris, sternocleidomastoid,  and left facial muscles.  Deep tendon reflexes are 1+ throughout,  bilateral downgoing toes.  Sensation is full to pinprick, light touch, vibration throughout.  Coordination; remakably the  cerebellar finger-to-nose test bilaterally  is smooth.  Heel-to-shin was not able to be tested.  Fine motor movements are slightly clumsy on the left, but smooth on the  right.   LABORATORY DATA:  Urinary analysis is negative.  Sodium is 139,  potassium 3.9, chloride 105, CO2 is 23, BUN is 8, creatinine 0.57, and  glucose 96.  White blood cell count 5.2, platelets 273,  hemoglobin/hematocrit 12.3 and 37.4.   IMAGING TEST:  1. MRI of L-spine shows subacute compression at L4 without signs of      retropulsion or central compression.  2. Vertebroplasty changes in L3.  3. Central disk protrusion at L3-4 with extensive discoid material.   ASSESSMENT:  This 77 year old female with dystonia gravidarum during  time of pregnancy at the age of 48.  Dystonic contractions of left face, neck, rhomboid, and now left trunk.  The patient has had past relief with botulinum toxin injections, stating  that the oral medications in the past no longer gave her relief.  At this time, we will try Cogentin 1 mg p.o. b.i.d. and also an  extensive rehab consultation.  I would also like to have the patient seen in an outpatient rehab  setting , and in the office , where Dr. Thad Ranger could re-consider  botulism toxin injections to get some relief.    We asked Mrs. [name] to make a follow up appointment with Dr. Thad Ranger  or at [facility] after concluding her rehabilitation and depending on  outcome .     ______________________________  [name],  [name]      Melvyn Novas, M.D.  Electronically Signed    DS/MEDQ  D:  08/06/2008  T:  08/07/2008  Job:  [id number]   cc:   Hollice Espy, M.D.   Valerie A. Felicity Coyer, MD  [address] [place], [place] [address]

## 2010-08-03 NOTE — Discharge Summary (Signed)
[name], [name]                ACCOUNT NO.:  [id number]   MEDICAL RECORD NO.:  [id number]          PATIENT TYPE:  INP   LOCATION:  1540                         FACILITY:  [facility]   PHYSICIAN:  Bruce Rexene Edison. Swords, MD    DATE OF BIRTH:  Aug 16, 1930   DATE OF ADMISSION:  08/05/2008  DATE OF DISCHARGE:  08/12/2008                               DISCHARGE SUMMARY   DISCHARGE DIAGNOSES:  1. Progressive spastic dystonia.  2. Anxiety.  3. History of compression fracture.  4. Hypertension.  5. Chronic dysarthria.  6. Monoclonal gammopathy of unknown significance.   DISCHARGE MEDICATIONS:  1. Omeprazole 20 mg p.o. daily.  2. Polyethylene glycol 17 grams p.o. daily in water.  3. Pred Forte 1 drop in both eyes four times daily.  4. Zoloft 50 mg p.o. daily.  5. Cogentin 2 mg p.o. b.i.d.  6. Albuterol MDI 2 puffs q.4 h p.r.n.  7. Ipratropium MDI 2 puffs q.8 h p.r.n.  8. Tylenol 650 mg p.o. q.6 h p.r.n.  9. Valium 10 mg 1/2 to 1 tablet p.o. q.i.d. p.r.n. anxiety or muscle      spasm.   HOSPITAL CONSULT:  Neurology to evaluate progressive dystonia.  Medications changed as above.   HOSPITAL PROCEDURES:  Chest x-ray demonstrated no acute cardiopulmonary  findings.   LABORATORY WORK:  Folate level 904 (normal 180-600), vitamin D level  normal at 54.  B12 normal at 416.  Iron level normal at 56, magnesium  normal at 2.3.  BMET normal on Aug 10, 2008.  CBC with hemoglobin of  11.9, Aug 10, 2008.   CONDITION ON DISCHARGE:  Improved.   FOLLOW-UP PLANS:  With Dr. Thad Ranger or Dr. Terrace Arabia at [facility] (number [phone]) to be scheduled in 1-2 weeks.   Medical follow-up with nursing home physician.   HOSPITAL COURSE:  The patient admitted to the hospitalist service on Aug 05, 2008.  The patient admitted with progressive dystonia.  The patient  was seen in consultation by neurology.  Final recommendations are  included in medication list as above.  The patient also was seen in  consultation by rehab.  The patient was recommended for long-term  nursing home placement.  At this time the patient is stable for  discharge on above medications.   DISCHARGE EXAM:  Afebrile, vital signs stable and the patient is in no  acute distress.  CHEST:  Clear to auscultation.  CARDIAC:  S1-S2 regular.  ABDOMEN:  Obese, active bowel sounds, soft.  EXTREMITIES:  No clubbing, cyanosis or edema.  NEUROLOGY:  She is alert and oriented.  She has chronic dysarthria.  She  is able to move all four extremities.   Greater than 30 minutes spent discharge planning.      Bruce Rexene Edison Swords, MD  Electronically Signed     BHS/MEDQ  D:  08/12/2008  T:  08/12/2008  Job:  [id number]

## 2010-08-03 NOTE — H&P (Signed)
[name], [name]                ACCOUNT NO.:  [id number]   MEDICAL RECORD NO.:  [id number]          PATIENT TYPE:  EMS   LOCATION:  MAJO                         FACILITY:  [facility]   PHYSICIAN:  Hollice Espy, M.D.DATE OF BIRTH:  [birth year]   DATE OF ADMISSION:  09/26/2006  DATE OF DISCHARGE:                              HISTORY & PHYSICAL   PRIMARY CARE PHYSICIAN:  Birdie Sons, M.D.   CHIEF COMPLAINT:  Vertigo.   HISTORY OF PRESENT ILLNESS:  The patient's history is a little impaired  in that she has an extensive history.  Her family was present earlier  and they were able to help relate her history to the emergency room  attending who was able to pass it on to me.  When I saw the patient,  while she is alert and oriented, her speech is somewhat muted because of  her slurred speech which reportedly is chronic but the patient's family  is not present and so therefore the history again is either given  secondhand or is somewhat impaired by the patient directly but from what  I can gather the patient has a history of a previous CVA, she has had  some issues of facial droop, and possibly slurred speech as well as  falls in the past.  For the past two days she has had new onset vertigo  which occurs whenever she sits up or stands up and has not really  relapsed at all unless she is lying down.  These symptoms have persisted  and she came into the emergency room for further evaluation.  She has  had no problems with acute weakness on one side of her body or the other  or any kind of headache, vision changes, or other medical problems.   When she came into the emergency room a CT scan of the head done was  showing signs of old frontal encephalomalacia but no signs of an acute  stroke.  The patient tells me that currently while lying down she is  feeling better and not having any current problems at this time.  She  denies any headaches, vision changes, no dysphagia, no chest pain,  no  palpitations, no shortness of breath, no wheezing, no coughing, no  abdominal pain, no hematuria or dysuria, constipation, or diarrhea.  She  has, however, noted and she tells me that this has been going on  occasionally as of in the last few weeks that when she stands up she  notices that from her bilateral knees down she has problems with  numbness and burning.  While she is lying down currently she feels okay.  She says she does not really loose control of her legs other than these  symptoms and when she lies down this seems to improve her symptoms.  She  confirmed again that this was bilaterally.   REVIEW OF SYSTEMS:  The review of systems otherwise is as listed above,  otherwise negative.   PAST MEDICAL HISTORY:  The patient's past medical history includes a  previous history of cerebrovascular accident, depression, frequent  falls, and  progressive spastic dystonia, history of monoclonal  gammopathy, history of L3 compression fracture, labile hypertension, and  acute on chronic debilitation.   MEDICATIONS:  The doses are based on a previous discharge summary from  11/07 but she is on Zyprexa 5 p.o. q.h.s., Detrol-LA 4 p.o. daily,  Elavil 100 p.o. q.h.s., and Xanax.   ALLERGIES:  She has no known drug allergies.   SOCIAL HISTORY:  No tobacco, alcohol, or drug use.   FAMILY HISTORY:  The family history is noncontributory.   PHYSICAL EXAMINATION:  VITAL SIGNS:  The patient's vital signs on  admission show a temperature of 97.9, heart rate of 97, blood pressure  of 130/86, respiratory rate is 12, O2 saturation is 90% on room air, up  to as high as 94% on room air.  GENERAL:  The patient appears to be alert and oriented.  HEENT:  Normocephalic although she does have a facial droop to the left,  it appears to be chronic according to the emergency room doctor who had  heard this from the family.  She has no carotid bruits.  Her mucous  membranes are slightly dry.  HEART:  The  heart is a regular rate and rhythm with a very soft 2/6  systolic ejection murmur.  LUNGS:  The lungs are somewhat limited secondary to body habitus,  otherwise clear to auscultation bilaterally.  ABDOMEN:  The abdomen is soft, obese, nontender, decreased bowel sounds.  EXTREMITIES:  The extremities show no clubbing, cyanosis, trace pitting  edema.  She has the presence of bunions and some arthritic changes in  her feet.  In terms of evaluating neuropathy, she appears to have  currently while lying down, she has no complaints of numbness, tingling,  or sensory loss and appears to be intact at this time.  NEUROLOGICAL:  No neurological findings, in terms she has perhaps some  mild nonspecific generalized weakness of flexion, extension, and grip,  at most a 5 to 5-/5, but again nothing focal and nothing medically  outstanding.  I attempted to elicit some sort of nystagmus while the  patient was flat on her back, moving her head as well as eye examination  and this elicited no nystagmus, no induction of any type of vertigo.  I  made a brief attempt to sit the patient upright and she immediately  starting complaining of severe dizziness and had to lie back down.  She  was not able to say if this caused her to have any type of nystagmus.   LABORATORY WORK:  White count 4.5, H and H of 12.8 and 37.3, MCV of 96,  platelet count of 273, no shift.  Urinalysis notes a moderate leukocyte  esterase.  Sodium of 141, potassium of 3.9, chloride of 106, bicarb of  26, BUN of 17, creatinine of 0.8, glucose of 95.  Her urinalysis micro  showed 11-20 white cells and many bacteria.   ASSESSMENT AND PLAN:  1. Vertigo:  Certainly this could be concerning for a possible brain      stem CVA, especially in the fact that it appears to be ongoing for      the past two days continuously.  On the other hand this may be      something simpler such as benign positional.  Nevertheless the plan      to start off with  will be checking an MRI, MRA of the head, and in      the meantime treat with Zofran and  Antivert p.r.n.  Will also get a      physical therapy and occupational therapy consult.  2. Question neuropathic symptoms on standing:  I would be concerned      about the possibility of a herniated disk or spinal stenosis and      will check an MRI of the lumbar spine  3. History of spasticity:  Continue her other medications.      Hollice Espy, M.D.  Electronically Signed     SKK/MEDQ  D:  09/26/2006  T:  09/26/2006  Job:  [id number]   cc:   Hollice Espy, M.D.  Valerie A. Felicity Coyer, MD  Valetta Mole Swords, MD

## 2010-08-03 NOTE — Discharge Summary (Signed)
NAMEJAYELLE, PAGE                ACCOUNT NO.:  [id number]   MEDICAL RECORD NO.:  [id number]           PATIENT TYPE:  INP   LOCATION:                               FACILITY:  [facility]   PHYSICIAN:  Valerie A. Felicity Coyer, MD     DATE OF BIRTH:   DATE OF ADMISSION:  DATE OF DISCHARGE:  10/02/2006                               DISCHARGE SUMMARY   DISCHARGE DIAGNOSES:  1. Transient/paroxysmal dizzy spells.  2. Escherichia coli urinary tract infection.  3. Chronic spasticity.  4. Hypoxia. CT angio chest negative for PE.   HISTORY OF PRESENT ILLNESS:  [name] is a 76 year old female who was  admitted on September 26, 2006, with chief complaint of vertigo.  She noted  she had a history of vertigo which occurs whenever she sits up or  stands.  These symptoms have persisted which prompted the patient to  come the emergency room for further evaluation and treatment.   PAST MEDICAL HISTORY:  1. Cerebrovascular accident.  2. Depression.  3. Frequent falls.  4. Progressive spastic dystonia.  5. History of monoclonal gammopathy.  6. History of L3 compression fracture.  7. Labile hypertension.  8. Acute on chronic debilitation.   HOSPITAL COURSE:  PROBLEM #1 - TRANSIENT PAROXYSMAL DIZZY SPELLS:  The  patient was continued on p.r.n. meclizine.  There is no clear  neurological or metabolic cause.  Plan at time of discharge was for  outpatient follow-up.   PROBLEM #2 -  ESCHERICHIA COLI URINARY TRACT INFECTION:  The patient was  continued on Macrobid at time of discharge.   PROBLEM #3 - CHRONIC SPASTICITY:  The patient was continued on home  muscle relaxants and home health.   DISCHARGE MEDICATION:  Macrobid 100 mg p.o. b.i.d. for 14 days.   DISPOSITION:  The patient was discharged to home.   FOLLOW UP:  The patient was instructed to follow up with Dr. Cato Mulligan in  one week and contact the office for an appointment.      Ashley Craze, [name]      Raenette Rover. Felicity Coyer, MD  Electronically Signed    MO/MEDQ  D:  11/01/2006  T:  11/02/2006  Job:  [id number]

## 2010-08-03 NOTE — H&P (Signed)
NAMESHANNEN, FLANSBURG                ACCOUNT NO.:  [id number]   MEDICAL RECORD NO.:  [id number]          PATIENT TYPE:  INP   LOCATION:  0101                         FACILITY:  [facility]   PHYSICIAN:  Hollice Espy, M.D.DATE OF BIRTH:  11/06/1930   DATE OF ADMISSION:  08/05/2008  DATE OF DISCHARGE:                              HISTORY & PHYSICAL   PRIMARY CARE PHYSICIAN:  Valetta Mole. Swords, MD.   CHIEF COMPLAINT:  Is worsening spasticity.   HISTORY OF PRESENT ILLNESS:  The patient is a 77-year white female past  medical history of progresses spastic dystonia and hypertension who was  last in the hospital approximately 2 months ago at that time for a  pneumonia and she seemed to be having some problems with weakness and  worsening of her dystonia requiring her to live in the skilled nursing.  The patient is difficult to understand but apparently she has been  having some problems increased pain in her extremities, her side and her  face to the point where she needed to come into the emergency room for  further evaluation.  She was sent over from [facility].  Upon arrival the patient was given doses of Dilaudid and Valium for pain  and that seemed to do little in terms of relieving her symptoms.  Because of lack of relief it was felt best that she be further treated  and evaluated as an inpatient.  When I saw the patient again she was  difficult to understand but she was telling me that apparently Ativan  used to work for her but it does not seem to do much for now and that  the medicines that she was given are not doing much for her.  She  complains of pain mostly drawn up in her right upper extremity and under  her left side.  She looks somewhat uncomfortable.  She says that she is  having no problems breathing and no chest pain and I am unable to get  much of review of systems from her otherwise review of systems looks to  be otherwise unremarkable.   PAST MEDICAL  HISTORY:  Includes progressive spastic dystonia, history of  pseudo wheezing, history depression, history of L4 compression fracture,  history of labile hypertension, history of CVA, anxiety, frequent falls,  chronic dysarthria, history of Klebsiella UTI, monoclonal gammopathy of  unknown significance.  She has a history of kyphoplasty of L3.   MEDICATIONS:  1. DuoNebs q.4 h p.r.n.  2. Robaxin 1000 p.o. b.i.d.  3. Sorbitol 70% daily.  4. Senokot Plus p.o. daily.  5. MiraLax daily.  6. Prilosec 20.  7. Darvocet 10/100 three times a day.  8. Ativan 1 four times a day.  9. Elavil 100 q.h.s.   ALLERGIES:  NO KNOWN DRUG ALLERGIES.   SOCIAL HISTORY:  No tobacco, alcohol or drug use.  She resides at the  [facility].   FAMILY HISTORY:  Noncontributory.   PHYSICAL EXAM:  VITAL SIGNS:  The patient's vitals on admission;  temperature was 98.4, heart rate 101 now down to 89, blood pressure  135/97, respirations 22, O2 saturation 92% on room air.  GENERAL: She is alert and oriented x3 in some distress secondary to pain  and discomfort.  HEENT: Normocephalic atraumatic.  Mucous membranes are dry.  Her face is  drawn up in a dystonic reaction.  HEART:  Regular rate and rhythm S1-S2.  LUNGS:  Appear to be clear breath sounds bilaterally.  ABDOMEN:  Soft, nontender, nondistended.  Positive bowel sounds although  she complains of pain more lateral to the left side.  She is drawn up in  that area.  EXTREMITIES:  Her right upper extremity is contracted.  She has trace  pitting edema bilaterally.   LAB WORK:  Is essentially unremarkable noting a normal chest x-ray.  Sodium 139, potassium 3.9, chloride 105, bicarb 23, BUN 8, creatinine  0.6, glucose 96.  White count 5.2, H and H 12.6 and 37, MCV of 100,  platelet count 273.  Urinalysis negative.   ASSESSMENT/PLAN:  1. Worsening of a progressive spastic dystonia.  We will try some IV      Valium and Benadryl along with continued Robaxin and  p.r.n.      medications for pain.  PT/OT to see.  2. Depression.  Continue Elavil.  3. Glaucoma.  Continue eye drops.  If the patient's are not improving      we will consult neurology.      Hollice Espy, M.D.  Electronically Signed     SKK/MEDQ  D:  08/05/2008  T:  08/06/2008  Job:  [id number]   cc:   Valetta Mole. Swords, MD  [address] [place]  [place] [address]

## 2010-08-06 NOTE — Discharge Summary (Signed)
[name], SODEN                ACCOUNT NO.:  [id number]   MEDICAL RECORD NO.:  [id number]          PATIENT TYPE:  INP   LOCATION:  3020                         FACILITY:  [facility]   PHYSICIAN:  Rene Paci, M.D. LHCDATE OF BIRTH:  [birth year]   DATE OF ADMISSION:  06/13/2004  DATE OF DISCHARGE:  06/18/2004                                 DISCHARGE SUMMARY   DISCHARGE DIAGNOSES:  1.  Back pain with partial compression deformity at L3.  2.  Marginal respiratory hypoxemia.  3.  Urinary tract infection.   HISTORY OF PRESENT ILLNESS:  [name] is a 77 year old white female with  progressive dystonic neurologic disease associated with R sided weakness.  However, she presented with back pain involving her low lumbar region.  She  was status post a fall.   PAST MEDICAL HISTORY:  1.  Hypertension.  2.  Progressive spastic dystonia.  3.  Depression.  4.  Monoclonal gammopathy of undetermined significance.  5.  Stereotactic brain surgery in 1972 for right limb dystonia.  6.  Status post hysteroscopy and D&C in 2003.  7.  TAH in October 2003 secondary to low grade squamous epithelial lesions      of the cervix.   HOSPITAL COURSE:  Problem 1.  Back pain.  The patient was found to have a  partial compression deformity at L3 and confirmed by MRI.  The patient was  seen by interventional radiology and underwent kyphoplasty on June 16, 2004  with improvement in her pain.  The patient has continued to complain of some  right shoulder pain.  Therefore, we have ordered right shoulder films to  rule out fracture.  These are pending.   Problem 2.  Pulmonary.  The patient has been noted to have some nighttime  hypoxemia.  Chest x-rays were unremarkable.  We suspect she may have  underlying OHS versus OSA and have recommended an outpatient sleep study at  discharge.   Problem 3.  Infectious diseases.  The patient is afebrile but urinalysis on  admission was consistent with a UTI.  The  patient was treated with three  days of Cipro.  Currently she is afebrile with a normal white count.   Problem 4.  The patient was seen by PT and OT, who both made recommendations  for home health versus a very short term inpatient rehab course.  These  arrangements are in progress.   Problem 5.  Labile hypertension.  The patient's blood pressures ranged from  systolic blood pressures of 110s to the 180s.  The patient was not on any  antihypertensives on admission, and we have started the patient on some  Norvasc.   DISCHARGE LABORATORIES:  Hemoglobin 12.9.  Coags were normal.  Basic  metabolic panel was normal.  Urinalysis was consistent with a urinary tract  infection.  No cultures were obtained.   MEDICATIONS ON ADMISSION:  1.  Zyprexa 5 mg daily.  2.  Elavil 100 mg at bedtime.  3.  Botox per Dr. Thad Ranger.   DISCHARGE MEDICATIONS:  1.  Zyprexa at bedtime.  2.  Ativan 0.5  mg q.8h. p.r.n.  3.  Elavil 100 mg at bedtime.  4.  Miacalcin nasal spray daily.  5.  Skelaxin 800 mg q.8h. p.r.n.  6.  Norvasc 5 mg daily.   FOLLOWUP:  We will arrange for the patient to have an outpatient sleep  study, otherwise follow up with Dr. Cato Mulligan in 1-2 weeks after discharge from  rehab.      LC/MEDQ  D:  06/18/2004  T:  06/18/2004  Job:  [id number]   cc:   Valetta Mole. Swords, M.D. [facility]   Casimiro Needle L. Thad Ranger, M.D.  [address]  [address]  [place]  [place] [address]  Fax: [phone]

## 2010-08-06 NOTE — Discharge Summary (Signed)
[name], [name]                          ACCOUNT NO.:  [id number]   MEDICAL RECORD NO.:  [id number]                   PATIENT TYPE:  INP   LOCATION:  0377                                 FACILITY:  [facility]   PHYSICIAN:  Rene Paci, M.D. [facility]          DATE OF BIRTH:  1930-12-28   DATE OF ADMISSION:  05/23/2002  DATE OF DISCHARGE:  06/04/2002                                 DISCHARGE SUMMARY   DISCHARGE DIAGNOSES:  1. Generalized dystonia with exacerbation.  2. 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 (Monoclonal gammopathies [of] undetermined significance  [MGUS]).  Follow up with heme in six months.  1. Depression status post inpatient psychiatry evaluation.  Outpatient     followup arranged.  2. Hypertension.  3. History of stereotactic brain surgery in 1972 for right limb dystonia.  4. History of hysteroscopy and dilatation and curettage in 2003.  5. Dysarthria secondary to #1 above.   DISCHARGE MEDICATIONS:  1. Duragesic patch 25 mcg q.72h.  2. Valium 5 mg at 8 a.m. and 2 p.m. and 2 mg q.h.s.  3. Baclofen 10 mg at 6 a.m., 10 a.m. 2 p.m. and 6 p.m.  4. Lexapro 10 mg 1/2 tablet p.o. daily.  5. Vicodin 5/500 one to two tables p.o. q.6h. p.r.n. pain.  6. Zyprexa 5 mg p.o. q.h.s. as prior to admission.  7. Elavil 100 mg q.h.s. as prior to admission.  8. Lotensin HCT 20/12.5 mg 1 q.a.m. as prior to admission.   DISPOSITION:  The patient is discharged to home where she will be followed  with home health care.  She and her family have refused placement in skilled  nursing facility, and no SACU beds are available for rehabilitation at this  time.  She was medically stable and improved.  Hospital followup scheduled  as follows.   FOLLOW UP:  1. [facility] March 18 at 1 p.m.  2. Valetta Mole. Swords, M.D., March 24 at 2:45 p.m.  3. Casimiro Needle L. Reynolds, M.D., April 6 at 1 o'clock p.m.   HISTORY AND PHYSICAL:  This is a  77 year old white female with history of  progressive and malignant spastic dystonia with progressive symptoms of  failure to thrive at home with frequent falls.  She tells of inability to  ambulate and increased pain in her chest, abdomen, and neck.  The patient  was admitted for further evaluation by neurology.   HOSPITAL COURSE:  1. DYSTONIA EXACERBATION:  The patient had previously been treated with     botulinum toxin most recently, now with failure to respond to this.  She     was also tried on Artane but complained of side effects causing nausea     and confusion. She also reported inability to sleep with Ativan.  She had     benefit from Skelaxin but was unable to afford this medication.  Accordingly at this time during hospitalization, medications were changed     to increase dose of Valium and add Baclofen.  She experienced some     somnolence with these changes, and her Valium dose was decreased to dose     as dictated above.  Her symptoms have been improved, and pain has been     controlled with the addition of low-dose Duragesic patch and p.r.n.     Vicodin.  At the time of discharge, she has been medically stable on this     regimen for three days and is near her prehospitalization baseline.  She     is debilitated but refuses skilled nursing facility placement.  There are     no SACU beds available at this time.  Accordingly, she is being     discharged to home where she will continue with assistance from her     family and home health care.  Hospital followup has been arranged as     discussed above.   1. DEPRESSION:  The patient's family was concerned over her depressed mood.     Accordingly, a psychiatric consult was obtained.  Dr. Jeanie Sewer felt that     the patient would benefit from Lexapro in addition to her Zyprexa, and     this is reflected in the changes as above.  She has followup at     [facility] as stated above.   1. MONOCLONAL GAMMOPATHIES  [OF] UNDETERMINED SIGNIFICANCE (MGUS):     Hematology/oncology was consulted during this hospitalization secondary     to finding of monoclonal IgG kappa protein spike on her SPEP.  There is     concern for possible myeloma, and appropriate lab studies were performed.     Her 24-hour UPEP was negative, and no Jones proteins were found.  Her     bone scan was negative for any myeloma, and at this time it is felt to     represent MGUS and not myeloma.  Dr. Myna Hidalgo recommended followup of her     M spike with a repeat lab in approximately six months.  He reports that     in approximately 20% of patients, the M spike can develop into a myeloma,     but this is not the general case.  He felt that it was likely related to     her chronic neuromuscular disorder and not in itself a problem.                                                 Rene Paci, M.D. [facility]    VL/MEDQ  D:  06/04/2002  T:  06/04/2002  Job:  [id number]
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
## 2010-08-06 NOTE — Consult Note (Signed)
[name]                ACCOUNT NO.:  [id number]   MEDICAL RECORD NO.:  [id number]          PATIENT TYPE:  INP   LOCATION:  3020                         FACILITY:  [facility]   PHYSICIAN:  Sanjeev K. Deveshwar, M.D.DATE OF BIRTH:  [birth year]   DATE OF CONSULTATION:  06/14/2004  DATE OF DISCHARGE:                                   CONSULTATION   CHIEF COMPLAINT:  Low back pain.   HISTORY OF PRESENT ILLNESS:  We were asked to see this 77 year old female  admitted to [facility] on June 13, 2004 by Dr. Illene Regulus with  a history of back pain which has been worse over the past week.  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  Recently, she has  had multiple falls.  She has been experiencing some low back pain and there  is some concern that possibly she had developed a compression fracture as a  result of her falls.  Plain films of the lumbar spine performed this  admission showed a partial compression deformity of the anterior/superior  aspect of L3 with osteoporosis, but no retropulsion.  Dr. Corliss Skains was  asked to evaluate the patient for possible kyphoplasty.   The patient is somewhat of a difficult historian.  She has a severe  dysarthria which makes her speech somewhat unintelligible.  She was able to  tell me that she has had multiple falls.  She is not certain why she is  falling.  This may be due to her progressive dystonia.  She tells me that  her back pain has been going on for at least several weeks, but has been  worse over the past week.  She apparently did have a fall on the day of  admission as well.   PAST MEDICAL HISTORY:  1.  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  She has vocal      atrophy and dysarthria.  2.  There is a history of myoclonal gammopathy of undetermined significance.  3.  She had stereotactic brain surgery in the past  performed at [facility] in 1972 secondary to a right lumb dystonia.  4.  She has a history of hypertension.  She was recently evaluated by Dr.      Gala Romney with [facility] to rule out coronary artery disease.      She had a Myoview performed.  This showed an ejection fraction of 69%      with some mild reversibility which was felt to be due to attenuation.      She was also noted to have some mild diastolic dysfunction which was      treated with diuretic therapy.   The patient does have a history of some hypoxemia.  A pulmonary consult has  been recommended.  This might be due to hypoventilation.  She also has a  history of anxiety and depression.  She is status post hysterectomy and D&C  in 2003.  She is status post  the above noted brain surgery performed at [facility] in [phone].   ALLERGIES:  No known drug allergies.   CURRENT MEDICATIONS:  1.  Elavil 100 mg at bedtime.  2.  Toradol 30 mg IV q.8h. p.r.n.  3.  Ativan 0.5 mg t.i.d.  4.  Zyprexa 5 mg each evening.  5.  Tylenol p.r.n.  6.  Senna p.r.n.  7.  She apparently had been receiving some Botox on an outpatient basis.   SOCIAL HISTORY:  The patient is divorced.  She has four children.  She lives  alone in [place].  Her children are very supportive.  She has never  smoked or used alcohol to any significant degree.   FAMILY HISTORY:  Noncontributory.   REVIEW OF SYSTEMS:  Negative except for the following.  As noted, she has  had some recent falls with subsequent back pain.  She has dysarthria which  is a long-standing problem.  She reports some swelling in her right upper  extremity as well as pain in the right upper extremity.  She believes she  may have injured her right arm in one of her recent falls.  She does have  occasional shortness of breath.  She has osteoarthritis.  She admits to  occasionally being disoriented.  She has some difficulty with her speech,  as  noted.  She has occasional inability to concentrate.  She has a history of  anxiety and depression.  She was noted to be anemic during this hospital  stay.   PHYSICAL EXAMINATION:  GENERAL:  Pleasant, but somewhat lethargic 73-year-  old white female in no acute distress.  VITAL SIGNS:  Blood pressure 111/65, pulse 98, temperature 98.1,  respirations 20.  HEENT:  Unremarkable except for the patient's significant dysarthria.  Her  face appears to be symmetrical.  NECK:  No bruits.  No jugular venous distention.  HEART:  Regular rate and rhythm without murmur.  LUNGS:  Clear, but slightly decreased.  ABDOMEN:  Obese, soft, nontender.  BACK:  Tenderness to palpation in a low lumbar area.  She does have some  difficulty moving around in the bed due to her pain.  EXTREMITIES:  No significant edema.  Pulses are intact.  SKIN:  Warm and dry.  NEUROLOGIC:  Grossly intact except for the dysarthria, as noted, and  somewhat decreased motor strength as well as motor stiffness and spasticity.   LABORATORY DATA:  Hemoglobin 12.9, hematocrit 38, BUN 20, creatinine 0.9,  potassium 4.3, glucose 80.  BNP 19.  TSH 2.55.  A chest x-ray showed  cardiomegaly with low volumes and nodular densities in both suprahilar  regions, question vascular.  Follow-up recommended.  I believe a follow-up  chest x-ray has been ordered.  Her spine films showed partial compression  deformity of the anterior/superior aspects of L3 with osteopenia and no  retropulsion.   IMPRESSION:  1.  Low back pain with partial compression deformity by plain films.  2.  History of progressive spastic dystonia followed by Dr. Thad Ranger.  3.  History of myoclonal gammopathy of undetermined significance.  4.  History of anxiety and depression.  5.  History of hypertension.  6.  Multiple recent falls.  7.  History of stereotactic brain surgery performed at [facility] 1972 secondary to a right limb dystonia.   8.  Status post hysterectomy and D&C in 2000.  9.  History of hypoventilation with low oxygen saturations possibly due to  her neurological condition.  10. Recent cardiac evaluation per Dr. Gala Romney with a Myoview revealing      ejection fraction of 69% with mild reversibility possibly secondary to      attenuation and a question of mild diastolic dysfunction.  11. Abnormal chest x-ray as noted above with follow-up recommended.  12. History of dysarthria and vocal atrophy.   PLAN:  The patient will have an MRI to further evaluate her compression  fractures.  I have tried to contact the family to discuss the possible  kyphoplasty.  However, they were not available.  I have left a message.  Dr.  Corliss Skains and/or myself will speak with the family and the patient in depth  prior to scheduling of the kyphoplasty as felt to be indicated based on the  MRI results which are currently pending.  Will also check a PT and a PTT to  evaluate her coagulation status prior to any procedures.      DR/MEDQ  D:  06/14/2004  T:  06/14/2004  Job:  [id number]   cc:   Valetta Mole. Swords, M.D. [facility]   Casimiro Needle L. Thad Ranger, M.D.  [address]  [address]  [place]  [place] [address]  Fax: [phone]

## 2010-08-06 NOTE — Discharge Summary (Signed)
NAMELANYA, BUCKS                ACCOUNT NO.:  [id number]   MEDICAL RECORD NO.:  [id number]          PATIENT TYPE:  INP   LOCATION:  1609                         [facility]   PHYSICIAN:  Valerie A. Felicity Coyer, MDDATE OF BIRTH:  1931/01/03   DATE OF ADMISSION:  02/12/2006  DATE OF DISCHARGE:  02/14/2006                               DISCHARGE SUMMARY   DISCHARGE DIAGNOSES:  1. Acute on chronic debilitation with recurrent falls.  2. Progressive spastic dystonia.  3. A history of monoclonal gammopathy of undetermined significance.      No signs to indicate current myeloma or progression.  4. A history of L3 compression fracture status post kyphoplasty March      2006.  5. A history of labile hypertension, not currently on      antihypertensives.   DISCHARGE MEDICATIONS:  As prior to admission without change and  include:  1. Detrol LA 4 mg p.o. daily.  2. Elavil 100 mg p.o. nightly.  3. Zyprexa 5 mg p.o. nightly.  4. Klonopin 0.5 mg q.i.d.  5. Robaxin 500 mg p.o. every 6 hours p.r.n.   DISPOSITION:  The patient is being discharged to skilled nursing  facility for short-term rehab and attempts for reconditioning and return  home.  Follow up with neurologist Dr. Thad Ranger well as a primary care  physician, Dr. Birdie Sons, upon release from skilled facility.   CONDITION ON DISCHARGE:  Medically stable and ready for rehab.   HOSPITAL COURSE BY PROBLEM:  Recurrent falls with progressive dystonia.  The patient is a pleasant and unfortunate 77 year old woman with  progressive neurologic spastic dystonia which greatly affects her right  upper extremity including remote stereotactic brain surgery at [facility] on  the left of her brain due to right arm spasticity who has been having  spells of recurrent falls at her home.  Because of this, no specific  injury was incurred.  She came to the [facility] Emergency Room for  evaluation where it was felt she was unsafe for continued  independent  living at her current state.  She was thus admitted for rule out of  other toxic abnormalities of which none could be identified.  Labs were  unremarkable.  Urinalysis was normal, and chest x-ray was without  abnormality.  She was seen by PT, OT, who agreed that due to her  deconditioning and generalized weakness from her neurologic disease she  would benefit with rehab at short-term facility with followup by home  health upon discharge.  The patient is agreeable to this plan and  arrangements have been underway to secure this.   DISPOSITION:  The patient is medically stable for discharge once this is  available with outpatient followup as scheduled.  No acute changes or  alterations of her home medication regimen have been made this  hospitalization.  No consults or further aggressive workup has been  pursued.   PHYSICAL EXAMINATION AT TIME OF DISCHARGE:  VITAL SIGNS:  Temperature  97.1, blood pressure recorded previous night at 156/98, pulse of 88,  respirations 20, saturating 93% on room air.  GENERAL:  In general, she is a pleasant woman with marked dysarthria due  to her underlying neurologic disease which is at baseline.  Notable  spasticity essentially in right upper extremity but diffusely across all  extremities with awkward movement and subsequent gait disorder.  LUNGS:  Clear to auscultation bilaterally.  CARDIOVASCULAR:  Exam is regular rate and rhythm.  ABDOMEN:  Protuberant, slightly obese, but soft and nontender with good  bowel sounds.  EXTREMITIES:  Show no edema.  NEUROLOGIC:  As above.  She is awake, alert, and oriented x4.      Valerie A. Felicity Coyer, MD  Electronically Signed     VAL/MEDQ  D:  02/14/2006  T:  02/14/2006  Job:  [id number]
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
## 2010-08-06 NOTE — Discharge Summary (Signed)
[name], [name] NO.:  [id number]   MEDICAL RECORD NO.:  [id number]          PATIENT TYPE:  INP   LOCATION:  3020                         FACILITY:  [facility]   PHYSICIAN:  Rene Paci, M.D. LHCDATE OF BIRTH:  1931/03/19   DATE OF ADMISSION:  06/13/2004  DATE OF DISCHARGE:  06/21/2004                           DISCHARGE SUMMARY - REFERRING   ADDENDUM:  The patient's discharge was held on June 18, 2004.  There was  not a subacute bed available for the patient.  Therefore, the patient and  family decided to pursue skilled nursing facility.  Arrangements were made  for the patient's transfer to the skilled nursing facility on June 21, 2004.  During the interim, the patient had complained of right upper  extremity pain and had fallen prior to admission.  We did obtain plain films  of her right upper extremity which were questionable for fracture.  This was  followed up with a CT of the right upper extremity that showed no  identifiable fractures.  No medication changes were made.  The patient is  stable for discharge to skilled nursing facility.      LC/MEDQ  D:  06/21/2004  T:  06/21/2004  Job:  [id number]

## 2010-08-06 NOTE — Discharge Summary (Signed)
NAMEDOMINIGUE, GELLNER                ACCOUNT NO.:  [id number]   MEDICAL RECORD NO.:  [id number]          PATIENT TYPE:  INP   LOCATION:  1609                         FACILITY:  [facility]   PHYSICIAN:  Valerie A. Felicity Coyer, MDDATE OF BIRTH:  05/14/1930   DATE OF ADMISSION:  02/11/2006  DATE OF DISCHARGE:  02/15/2006                               DISCHARGE SUMMARY   ADDENDUM:  The discharge summary is as previously listed without  significant change.   DISCHARGE DIAGNOSES:  As previously listed.   DISCHARGE MEDICATIONS:  There is only one change and that is Klonopin  which should be 1 mg p.o. q.i.d. and not as previously listed at 0.5 mg  q.i.d.   DISPOSITION:  The patient remained in the hospital for an additional 24  hours to secure bed arrangements for a short-term skilled facility.  The  patient and the family have accepted a position at [facility].  There  have been no changes overnight and she remains stable for discharge to a  skilled facility.  Please see the previous discharge summary dated  February 14, 2006, for the full details.      Valerie A. Felicity Coyer, MD  Electronically Signed     VAL/MEDQ  D:  02/15/2006  T:  02/15/2006  Job:  [id number]

## 2010-08-06 NOTE — H&P (Signed)
NAMESTEPHANI, [name]                ACCOUNT NO.:  [id number]   MEDICAL RECORD NO.:  [id number]          PATIENT TYPE:  INP   LOCATION:  3020                         FACILITY:  [facility]   PHYSICIAN:  Rosalyn Gess. Norins, M.D. LHCDATE OF BIRTH:  1930-06-28   DATE OF ADMISSION:  06/13/2004  DATE OF DISCHARGE:                                HISTORY & PHYSICAL   CHIEF COMPLAINT:  Back pain.   HISTORY OF PRESENT ILLNESS:  Ms. Bevill is a 77 year old single white female  with a history of progressive dystonic neurologic disease with significant  spasticity and weakness. She has had a history of back pain, but now reports  that it has been much worse since approximately last weekend with a location  in her low lumbar region. She reports that pain does radiate to her legs.  She reports this has caused her to be slow in her activities of daily living  and has affected her balance. On the day of admission she had fallen and was  unable to get up, and EMS was called. The patient feels that her back pain  is such that she is unable to manage at home at this time because of  increased difficulty standing and walking, and managing her activities of  daily living. The patient is now admitted for further evaluation and PT/OT  evaluation.   PAST MEDICAL HISTORY:  1.  Per old records. Patient with vocal atrophy attributed to age.  2.  Hypertension.  3.  Spastic dystonia, progressive.  4.  Depression.  5.  Myoclonal gammopathy of undetermined significance.   PAST SURGICAL HISTORY:  1.  Stereotactic brain surgery in 1972 for right limb dystonia at [facility].  2.  Hysteroscopy and D&C in 2003.  3.  TAH in October 2003 for low-grade squamous epithelial lesion of the      cervix.   GYNECOLOGIC HISTORY:  The patient is gravida 6, para 4.   CURRENT MEDICATIONS:  1.  Zyprexa 5 mg daily.  2.  Elavil 100 mg q.h.s.  3.  Botox injections per Dr. Thad Ranger.   The patient denies any other medications at this time.  Specifically denied  taking Lotensin and HCT when asked.   FAMILY HISTORY:  Noncontributory.   SOCIAL HISTORY:  The patient lives alone. Her children do look in on her.  She reports she is able to manage her ADLs.   REVIEW OF SYSTEMS:  Negative for any fever, sweats, chills, or other  constitutional symptoms. She denies any chest pain, chest discomfort, or  other problems, and has had cardiology workup as noted. No pulmonary  complaints. The patient has no GI complaints and has been evaluated by  gastroenterology in November 2005.  Had a CT of the abdomen and pelvis at  that time that was unremarkable. He evidently did have a colonoscopy, but  results are not available to me. No GYN problems.   PHYSICAL EXAMINATION:  VITAL SIGNS: Temperature 98, blood pressure 112/64,  pulse 108, respirations 22, O2 saturation was from 88% to 97%.  GENERAL APPEARANCE: An obese white female who is in  no acute distress, but  is dysarthric and has dry mucous membranes.  HEENT: Normocephalic and atraumatic. The patient seems to have dentures, but  I could not get a good view of her mouth.  NECK: Supple without thyromegaly.  NODES: No adenopathy was noted in the cervical or supraclavicular regions.  CHEST: The patient had tenderness to palpation at the low sacrum and to both  sides of midline.  LUNGS: Clear with no rales, rhonchi, or wheezes.  BREASTS: Deferred.  CARDIOVASCULAR: 2+ radial pulses. Had a quiet precordium. Regular rate and  rhythm. I do not appreciate any murmurs, rubs, or gallops.  ABDOMEN: Protuberant, firm with positive bowel sounds in all four quadrants,  without organosplenomegaly.  GENITALIA/RECTAL EXAM: Deferred.  EXTREMITIES: Upper extremities are normal. Lower extremities reveals  question of a foot drop on the left. Certainly she has a high arch  deformity.  NEUROLOGIC: The patient is awake and alert. She follows commands and she is  a reasonable historian. Her dysarthria makes  taking a history difficult.  Cranial nerves II-XII: The patient has some twisting of her face, but  actually no true facial asymmetry. Extraocular muscles are intact. Pupils  equal, round, and reactive. Motor strength: The patient moves all of his  extremities to command. Grip is 3/5. The patient is able to do straight leg  maneuver, but this causes tenderness or pain in her lower back. It is  painful for the patient to sit up as she was able to sit with minimal  assistance. DTRs could not be elicit because of stiffness and spasticity.   LABORATORY DATA:  UA was positive for 3-6 wbc's and many bacteria.  Chemistries reveal sodium of 137, potassium 4.3, chloride 104, CO2 31, BUN  20, creatinine 0.9, glucose 80. Hemoglobin 11.9 gm, white count 6900 with 77  segs, 18% lymphs, 4% monos, platelet count 303,000. Chest x-ray was clear.   ASSESSMENT/PLAN:  1.  Back pain. Patient now with low back and sacral pain exacerbated with      movement. The discomfort is limiting her activities of daily living and      she attributes her back pain to loss of balance and falls. Her      neurologic exam is difficult, but no gross radiculopathy is noted,      except for having pain with straight leg maneuver. Plan is for L/S spine      films with follow-up imaging if indicated. Toradol 30 mg IV q.8h. for      discomfort. PT and OT evaluation.  2.  Neurologic. Patient with degenerative dystonic disease with rapid      progression over the last two years with marked dysarthria. She is      followed on a regular basis by Dr. Thad Ranger with last office visit on      June 08, 2004, where she did have Botox injections.  The patient denies      at this time taking Valium, but does take Zyprexa and Elavil. She      reports she has taken lorazepam in the past with good results. Plan is     to continue her Zyprexa and Elavil. Will start lorazepam 0.5 mg t.i.d.  3.  Cardiovascular. The patient had a thorough evaluation  in March of this      year including an abnormal nuclear study.  She was seen by Dr. Gala Romney      in consultation. She was thought to have mild diastolic dysfunction and  he recommended Lasix. No evidence of coronary artery disease and no      further workup was recommended.  4.  Pulmonary. There is a question of whether the patient has mild      hypoventilation syndrome based on neurologic condition and weight. She      does seem to drop  her O2 saturations intermittently. Dr. Gala Romney, in      his note,      recommended pulmonary consult.  5.  Disposition. The patient had been living alone. Will get PT and OT      evaluation as above to ensure she will be able to manage her ADLs.      MEN/MEDQ  D:  06/13/2004  T:  06/13/2004  Job:  [id number]   cc:   Valetta Mole. Swords, M.D. [facility]   Casimiro Needle L. Thad Ranger, M.D.  [address]  [address]  [place]  [place] [address]  Fax: [phone]

## 2010-08-06 NOTE — H&P (Signed)
[name], [place]                          ACCOUNT NO.:  [id number]   MEDICAL RECORD NO.:  [id number]                   PATIENT TYPE:  INP   LOCATION:  0162                                 FACILITY:  [facility]   PHYSICIAN:  Titus Dubin. Alwyn Ren, M.D. 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  This has been progressive and malignant form over the last seven days, but  has been a chronic problem for months and years.  The symptoms are severe  enough at this point that she cannot even walk, falling forward.  She is  getting to the point where she cannot ambulate to the bathroom, and cannot  eat properly, or take any medications.  She has intermittent severe shaking.   PAST MEDICAL HISTORY:  1. Total abdominal hysterectomy in 10/03, for low-grade squamous intra-     epithelial lesion on Pap smear.  She had abnormal Pap smears dating back     to 2001.  She has a colposcopy with biopsy in 2001.  2. She has had six pregnancies and four vaginal deliveries.  She has had one     miscarriage.  3. She has had the dystonia and spasmodic torticollis with hyperkinetic     dysarthria treated with cervical Botox injections.  4. Vocal atrophy attributed to age.  5. Hypertension.  6. She had a stereotactic surgery in 1972, for right limb dystonia at [facility].  7. She has hysteroscopy and D&C in 2003.   ALLERGIES:  No known drug allergies.   MEDICATIONS:  1. Amitriptyline 100 mg 1/2 to one at bedtime.  2. Lotensin/HCT 10/12.5 mg one daily.  3. Zyprexa 5 mg daily.  4. Valium 10 mg t.i.d.   HABITS:  She does not drink or smoke.   REVIEW OF SYMPTOMS:  Intermittent chest pain of the lower substernal border  and epigastric area.  There  has been no neck or arm radiation.  There has  been no associated nausea with sweating.  This is not exertional.   She has had no abdominal pain, nausea, vomiting, or diarrhea.  She denies  any genitourinary symptoms.  She has had right facial pain intermittently.   PHYSICAL EXAMINATION:  GENERAL:  She exhibits intermittent coarse jerking  activity.  VITAL SIGNS:  She exhibits a sinus tachycardia with a rate of 101.  Blood  pressure is 117/55.  Respiratory rate is 20 to 25.  HEENT:  She has intermittent facial grimacing.  She has complete dentures.  The fundi cannot be visualized as she cannot cooperate with the examination,  and tends to close her eyes with light exposure.  Tympanic membranes are  normal.  NECK:  No carotid bruits are noted.  She has no lymphadenopathy about the  head, neck, or axilla.  Thyroid is small.  CHEST:  Clear with no increased work of breathing.  HEART:  The heart sounds are distant.  ABDOMEN:  The abdomen is protuberant and nontender.  EXTREMITIES:  Pedal pulses are decreased but intact.  She has trace edema.  There is some rigidity in the left lower extremity, less so in other  extremities.  She tends to exhibit some disuse of the left lower extremity,  but can move it.  NEUROLOGIC:  Her speech pattern is halting and a staccato, and difficult to  understand.  The history was obtained for the most part from her son.   LABORATORY DATA:  Despite her chest pain, the EKG did not reveal ischemic  change.  It was difficult to interpret, however, because of profound  muscular artifact.  There appeared to be diffuse nonspecific T changes.  CPK  was within normal limits as was troponin.   She is now admitted with failure to thrive due to the profound dystonia.  By  history, she has received Botox, but apparently has become somewhat  resistant to these.   PLAN:  She will be admitted and placed on Baclofen 5 mg t.i.d., in addition  to the present medications.   Neurology consultation with Dr. Thad Ranger will  be pursued.  She will receive morphine IV for pain.  I told the family that  I felt more comfortable working with a neurologist because of the severity  of her illness and the rarity of the condition.   Because of her hypertension, she will have sodium restriction.  The Foley  catheter will be inserted, and she will have a bedside commode.  Serial CK-  MB's will be collected.                                                Titus Dubin. Alwyn Ren, M.D. [facility]    [facility]  D:  05/23/2002  T:  05/24/2002  Job:  [id number]   cc:   Valetta Mole. Swords, M.D. [facility]

## 2010-08-06 NOTE — Consult Note (Signed)
NAMEALSACE, DOWD                          ACCOUNT NO.:  [id number]   MEDICAL RECORD NO.:  [id number]                   PATIENT TYPE:  INP   LOCATION:  0377                                 FACILITY:  [facility]   PHYSICIAN:  Deanna Artis. Sharene Skeans, M.D.           DATE OF BIRTH:  12-09-1930   DATE OF CONSULTATION:  05/24/2002  DATE OF DISCHARGE:                                   CONSULTATION   REASON FOR CONSULTATION:  I was asked by Dr. Amador Cunas to see this patient  for evaluation.  She is followed in our office by Dr. Jacki Cones.  She  has been followed in the past by Dr. Shireen Quan.  Also by Dr. Peterson Lombard at [facility].   The patient is a 77 year old woman who was admitted for failure to thrive,  inability to walk, difficulty with throwing up and shaking all over, has  progressed over the past several days.   The patient has actually had a steady decline since she was unable to  benefit from botulinum toxin.  Dr. Dan Humphreys had been giving it to her for  several years.  She presented to our office in 9/03, with a 32 year history  of dystonia that had been treated initially in 1972, with a left brain  stereotactic procedure that significantly improved her right-side  functioning.  Symptoms began six months after her pregnancy with twisting of  her mouth.  She was seen by Dr. Noreene Filbert for a number of years, and then seen  by Dr. Peterson Lombard at [facility] who began treating her with botulinum toxin  to decrease her spasms.  This worked quite well, particularly for her left  face and arm.   Symptoms began to become refractory to treatment, and on 10/02/01, the  patient was noted to have dysarthria.  The patient was seen by an ear, nose,  and throat physician at [facility], but no etiology could be discerned.  The patient has also had a long-standing history of depression, and has been  followed by Leone Haven.   The patient's initial  evaluation in 9/03, should facial grimacing involving  the left facial muscles, intermittent contractures of the platysma muscle on  the left, periodic dystonic movements of the left upper extremity with  internal rotation of her arm and hand, intermittent dystonia of the left  foot, variable tone in the left upper extremity.  She had clumsy rapid  repetitive movements on the left side and did favor her left foot with a  limp.   The patient was started on Artane 2 mg 1/2 tablet t.i.d., with plans to  increase her to one tablet q.i.d.   She was again by Dr. Noreene Filbert on 01/16/02.  The patient complained of side  effects of Artane which made her feel nervous, nauseated, and confused.  The  patient is also having problems with trying to  get off of Ativan which had  helped her for some time with her sleep, and is no longer helping.   Her neurological examination was basically unchanged.  Plans were made to  restart Artane at this time at a slower rate.  Plans were also made to have  Dr. Jacki Cones see her for Botox therapy.   Dr. Thad Ranger saw her on 05/08/02.  She continued to have problems with  dystonia, particularly with difficulty speaking and muscle spasms in her  neck and left upper extremity.  She took Skelaxin for this and found it  helpful, but was unable to afford it.  The patient complained of pain in her  neck and arms, subjective weakness in her face, and difficulty with speech,  shortness of breath, and hoarseness.  Other medical problems included  hypertension, for which she was seen by Dr. Birdie Sons, and depression,  for which she was seen by Dr. Leone Haven.  Dr. Thad Ranger noted that the  patient had symmetric movement of her tongue and palate, but her face showed  asymmetric with dystonic posturing and failure to relax, thus leading to  asynchronies movement of her face when she tried to talk causing dysarthria.  She had tightness in her trapezius and scalenes and  decreased range of  motion in her neck.  She had increased tone in her left upper extremity with  a fine tremor.  Strength was well preserved.  She had dystonic posturing in  her left leg, but this was less marked then the arm.  The patient was able  to walk on her own with the left arm in a dystonic posture with mild  abduction, internal rotation of the shoulder, and flexion of the wrist and  fingers.  Dr. Thad Ranger planned to evaluate the patient for botulinum toxic  antibodies, and started her on Robaxin 750 mg one or two t.i.d. in place of  Skelaxin.   The patient has failed to thrive at home.  She has increasing difficulty  with walking, and thus was admitted last night for evaluation.  I was asked  to see her.   REVIEW OF SYMPTOMS:  GENERAL:  Positive for hypertension.  PULMONARY:  No  pulmonary complaints.  NEUROLOGIC:  Neurological complaints as noted above.  HEMATOLOGIC:  No anemia or bruisability, lymphadenopathy.  ENDOCRINE:  No  diabetes or thyroid disease.  GASTROINTESTINAL:  No nausea, vomiting,  diarrhea, constipation or reflux.  GENITOURINARY:  No urinary tract  infection or dysuria.  The patient has not had incontinence.  MUSCULOSKELETAL:  Dystonia as noted above.  ENT:  The patient wears  corrective lenses, has cataracts, has not been operated on.  REPRODUCTIVE:  The patient is post-menopausal and has had a hysterectomy.  SKIN:  No  complaints.  The remainder of 12 system review is negative.   CURRENT MEDICATIONS:  1. Valium 10 mg t.i.d.  2. Lotensin 20 mg daily.  3. Hydrochlorothiazide 12.5 mg daily.  4. Amitriptyline 100 mg at bedtime.  5. Zyprexa 5 mg at bedtime.  6. Baclofen 5 mg t.i.d. (just started).  7. P.r.n. morphine sulfate 2 to 4 mg q.4h.   ALLERGIES:  No known drug allergies.   PAST SURGICAL HISTORY:  1. Total abdominal hysterectomy in 10/03.  2. The patient has had intermittent exacerbations of her dystonia over the    years, last was about one to  two years ago.   SOCIAL HISTORY:  The patient lives with her son.  She has not  driven an  automobile since April.  She does not use tobacco or alcohol.   LABORATORY DATA:  Normocytic anemia with hemoglobin of 11.9, MCV 92.8.  Normal platelet, blood count, and white count indices.  Normal comprehensive  metabolic panel with an albumin just barely in the normal range at 3.6, and  a protein that is in the high normal range of 8.4.  Urinalysis failed to  show evidence of urinary tract infection.  Troponin-I failed to show cardiac  injury.  Chest x-ray showed no interval change with prominent aortic valve.   PHYSICAL EXAMINATION:  GENERAL:  This is a pleasant woman in no acute  distress.  VITAL SIGNS:  Temperature 97.9, blood pressure 108/50, resting pulse 90,  respirations 22, pulse oximetry 91% on room air.  I&O is -500 for eight  hours.  HEENT:  No infection.  NECK:  Decreased range of motion of her neck.  Her neck is somewhat tender.  No meningismus, no bruits.  LUNGS:  Clear.  HEART:  No murmurs, pulses normal.  ABDOMEN:  Soft, protuberant, bowel sounds normal.  EXTREMITIES:  No edema or cyanosis.  NEUROLOGIC:  Mental status: The patient was awake, dysarthric, intelligible,  but difficult to understands.  Cranial nerves:  The patient has a left  facial asymmetry due to her dystonia.  Visual fields are full.  Extraocular  movements are full.  Pupils are round and reactive.  Fundi were not well  seen.  Tongue was midline, as was uvula.  Motor examination:  Essentially  normal strength.  She has a claw hand on the left side.  Finger apposition  is difficult, but she can oppose all of her fingers on both hands, although  she is more clumsy on the left side then the right.  She has definite  dystonia in the left arm in comparison with the right, and has mild dystonic  changes, left greater then right in the legs.  Sensation:  Mild peripheral  polyneuropathy, good stereoagnosis, gait is  festinating.  She appears to  have clonus of the left leg.  Her feet seem glued to the floor.  She is very  tentative at trying to walk.  Deep tendon reflexes showed a left reflex  predominance, she had bilateral flexor plantar responses.   IMPRESSION:  Organic gait disorder, 781.2.  The patient seems to have some  left-sided spasticity.  Need to evaluate the cervical spine for a left-sided  lesion.  She has long-standing dystonia, and is status post a left brain  stereotactic procedure in 1972.  Finally, serum electrophoresis would be  reasonable, and possibly immunoelectrophoresis looking for gammopathy.   The patient needs deep venous thrombosis prophylaxis because she is not  moving around much.  We will look and find if Botox antibodies have returned  so that we can possibly restart injections.  Unfortunately, this is a more  widespread problem now, I doubt that Botox will be helpful.  Possible medications that could be helpful would include, Baclofen (which has been  started), perhaps a second deep brain procedure this time with a stimulator  rather then an ablation procedure.  All of this needs to follow evaluating  what appears to be left-sided hyperreflexia worsening her dystonia.  I  appreciate the opportunity to participate in her care.  If you have  questions or if I can be of assistance, do not hesitate to contact me.  Deanna Artis. Sharene Skeans, M.D.    [facility]  D:  05/24/2002  T:  05/24/2002  Job:  [id number]   cc:   Valetta Mole. Swords, M.D. [facility]   Gordy Savers, M.D. [facility]

## 2010-08-06 NOTE — Op Note (Signed)
[facility]  Patient:    [name], [name] Visit Number: [id number] MRN: [id number]          Service Type: DSU Location: [facility] Attending Physician:  Melony Overly Dictated by:   Devoria Albe Edward Jolly, M.D. Proc. Date: 08/14/01 Admit Date:  08/14/2001                             Operative Report  PREOPERATIVE DIAGNOSES:       Thickened endometrial lining.  POSTOPERATIVE DIAGNOSES:      Intracavitary uterine mass.  PROCEDURE:                    Examination under anesthesia, fractional dilatation and curettage, hysteroscopic resection of endometrial mass.  SURGEON:                      Brook A. Edward Jolly, M.D.  ANESTHESIA:                   LMA, paracervical block with 1% lidocaine.  INTRAVENOUS FLUIDS:           1000 cc Ringers lactate.  ESTIMATED BLOOD LOSS:         Minimal.  URINE OUTPUT:                 50 cc prior to procedures.  COMPLICATIONS:                None.  DEFICIT:                      Sorbitol 5% 220 cc.  INDICATIONS FOR PROCEDURE:    The patient is a 77 year old gravida 20, para 26 Caucasian female who was noted to have a thickened endometrium measuring 15 mm with cystic change and blood flow to this region which was noted on a pelvic ultrasound of Jul 27, 2001 which was ordered to evaluate abdominal fullness. The left ovary contained a 1.4 cm bilobe cyst and there was free fluid around this adnexa.  There was a right ovarian remnant measuring 7 mm.  The patient also had a CT scan of the abdomen which documented only scoliosis.  An endometrial biopsy performed in the office Jul 27, 2001 documented fragmented proliferative endometrium, but was a limited sample.  Based on the ultrasound findings and the limited sample available through the pipelle biopsy, a recommendation was made to proceed with a hysteroscopy with D&C after the risks, benefits, and alternatives were reviewed with the patient.  She chose to proceed.  FINDINGS:                      The examination under anesthesia revealed a small, mid positioned uterus.  No adnexal masses were appreciated.  Hysteroscopy demonstrated a posterior fundal firm intracavitary mass measuring approximately 2 x 2 cm which essentially filled the uterine cavity.  This was removed through the hysteroscopic procedure.  PROCEDURE:                    With an IV in place the patient was taken to the operating room after she was properly identified.  The patient was placed in a supine position on the operating room table and anesthesia was induced.  The patient was then placed in the dorsal lithotomy position and the vagina and perineum were sterilely prepped and draped.  The bladder was  sterilely catheterized of urine.  An examination under anesthesia was performed.  A speculum was placed inside and a single tooth tenaculum placed on the anterior cervical lip.  A paracervical block was performed with 10 cc of 1% lidocaine in a standard fashion.  The uterus was sounded and the cervix was then serially dilated to a #19 Pratt dilator.  The diagnostic hysteroscope was then inserted and the findings are as noted above.  The hysteroscope was inserted under a continuous infusion of the sorbitol.  The hysteroscope was then removed and a fractional D&C was performed.  The endocervical and endometrial curettings were sent separately to pathology.  With the endometrial curettage a firm mass was palpable along the posterior uterine wall.  Both sharp and serrated curettage were performed in all four quadrants.  The hysteroscope was then reinserted under the infusion of sorbitol and the mass remained within the uterine cavity.  The cervix was therefore further dilated to a #29 Pratt dilator and the resectoscope was then inserted under the continuous infusion of sorbitol.  Using a combination of coagulation and cutting, the uterine mass was removed and was sent to pathology with the endometrial  curettings.  The base of the region where the mass was removed was then coagulated with monopolar cautery for good hemostasis.  This concluded the procedure.  All instruments were removed and hemostasis was noted to be good.  The patient was cleansed of any remaining Betadine and she was awakened and escorted to the recovery room in stable condition.  There were no complications to the procedure.  All needle, instrument, and sponge counts were correct. Dictated by:   Devoria Albe Edward Jolly, M.D. Attending Physician:  Melony Overly DD:  08/14/01 TD:  08/15/01 Job: [id number]

## 2010-08-06 NOTE — Discharge Summary (Signed)
[name], [name]                          ACCOUNT NO.:  [id number]   MEDICAL RECORD NO.:  [id number]                   PATIENT TYPE:  INP   LOCATION:  9116                                 FACILITY:  [facility]   PHYSICIAN:  Randye Lobo, M.D.                DATE OF BIRTH:  Mar 26, 1930   DATE OF ADMISSION:  01/02/2002  DATE OF DISCHARGE:  01/07/2002                                 DISCHARGE SUMMARY   ADMISSION DIAGNOSES:  1. Complex endometrial hyperplasia without atypia.  2. Ascus PAP smear suspicious for low grade squamous intraepithelial lesion.  3. Genuine stress incontinence.  4. Spasmodic torticollis with hyperkinetic dysarthria.   DISCHARGE DIAGNOSIS:  1. Status post total abdominal hysterectomy with bilateral salpingo-     oophorectomy, peritoneal washings, abdominal birch procedure with     cystoscopy.  2. Spasmodic torticollis with hyperkinetic dysarthria.  3. Bilateral atelectasis versus infiltrate.   PROCEDURE:  The patient underwent a total abdominal hysterectomy with  bilateral salpingo-oophorectomy, peritoneal washings, abdominal birch  procedure and cystoscopy on January 02, 2002 under the direction of Dr.  Conley Simmonds and with the assistance of W. Lodema Hong.   HISTORY OF PRESENT ILLNESS:  The patient is a 77 year old Gravida VI, Para  IV Caucasian female with complex endometrial hyperplasia without atypia, on  Ascus PAP smear suspicious for low grade squamous intraepithelial lesion,  and genuine stress incontinence confirmed by multi channel urodynamic  testing. The patient wished for surgical treatment of the endometrial  hyperplasia, abnormal PAP smear and urinary incontinence and a plan was made  to proceed with surgery on January 02, 2002.   PAST MEDICAL HISTORY:  As significant for long standing history of spasmodic  torticollis with hyperkinetic dysarthria, treated with amitriptyline,  Zyprexa, Lorazepam, intermittent Botox injections as well as  periodic use of  Skelaxin orally.   PHYSICAL EXAMINATION:  PELVIC: Examination reveals evidence of a minimal  cystocele. The vagina has good apical support. There is no evidence of a  rectocele. The cervix and vagina demonstrate no gross lesions. The uterus is  small and nontender. There are no adnexal masses. No tenderness appreciated.   HOSPITAL COURSE:  The patient was admitted on January 02, 2002 to the  [facility] at which time she underwent a total abdominal hysterectomy  with bilateral salpingo-oophorectomy, peritoneal washings, and abdominal  birch procedure, and cystoscopy while under general endotracheal anesthesia.  The estimated blood loss from surgery was 150 cc and there were no  complications. The bilateral ovaries had a clear vesicular texture to the  cortex and peritoneal washings were therefore performed at this time. There  were no other unremarkable features noted at the time of exploration of the  abdomen and pelvis. Postoperatively, the patient had good pain control with  the Morphine PCA. She was successfully titrated over to oral medication and  used primarily Ibuprofen for adequate pain relief. The  patient did have  difficulty with her muscular spasms, which she was accustomed to from her  spasmodic torticollis. She was treated with the Zyprexa and Amitriptyline.  She was periodically treated with the Skelaxin, which was discontinued as  the patient reported that it made her feel worse. After re-starting her  Lorazepam 1 mg by mouth three times a day, the patient did have significant  relief of her muscular spasms. The patient did have low grade temperatures  in the 100 degree range on postoperative day one and two and this was  attributed to atelectasis on physical examination. When the patient spiked a  temperature to 100.3 on January 04, 2002, a chest x-ray was performed, which  documented atelectasis bilaterally versus an infiltrate. The patient's  white  blood cell count, however, was noted to be 6.4. The patient was receiving  aggressive incentive spirometry therapy. Her ambulation was increased, and  she was started on initially Unasyn, which was then discontinued and  converted over to Cefuroxime IV. The patient's fevers did defervesce and at  the time of her discharge, she was continued on oral Ceftin 500 mg by mouth  twice a day for completion of therapy. Of note, after the patient's  discharge, a urine culture was noted to be positive for serratia marcescens.  The patient's Ceftin after discharge was switched over to Bactrim DS which  the serratia marcescens was sensitive to. The patient was able to ambulate  well at the time of her discharge. She did received both PAS stockings and  TED hose for deep vein thrombosis prophylaxis during her hospitalization.  The patient did undergo bladder training during the hospitalization. By the  day of her discharge, the patient was able to void adequately with  reasonable post void residuals in the range of 80 to 125 cc. A decision was  made to discontinue the Foley, therefore prior to her discharge. The patient  was able to tolerate a regular diet prior to her discharge. She was found to  be in improved condition on postoperative day five and ready for discharge.   DISPOSITION:  The patient is discharged to home. The patient's family will  help to take care of her once she returns to her home setting.   DISCHARGE MEDICATIONS:  The patient will continue on the Ibuprofen 600 mg by  mouth every six hours as needed. She was also instructed to take iron  sulfate 325 mg by mouth twice a day to treat her anemia, which was noted to  be present following surgery. Her hemoglobin at discharge was 9.2. The  patient will continue with her antibiotic therapy for seven days at home.   DIET:  Regular diet.   ACTIVITY:  She will have decreased activity.  FOLLOW UP:  She will follow-up in the office  in four weeks. She will call  sooner if she experiences any problems with recurrent fevers, difficulty  with breathing, incisional drainage or problems, or any other concern.                                                Randye Lobo, M.D.    BES/MEDQ  D:  02/09/2002  T:  02/09/2002  Job:  [id number]

## 2010-08-06 NOTE — Op Note (Signed)
[name], [name]                          ACCOUNT NO.:  [id number]   MEDICAL RECORD NO.:  [id number]                   PATIENT TYPE:  INP   LOCATION:  9399                                 FACILITY:  [facility]   PHYSICIAN:  Randye Lobo, M.D.                DATE OF BIRTH:  [birth year]   DATE OF PROCEDURE:  01/02/2002  DATE OF DISCHARGE:                                 OPERATIVE REPORT   PREOPERATIVE DIAGNOSES:  1. Complex endometrial hyperplasia.  2. ASCUS Pap smear suspicious for low grade squamous intraepithelial lesion.  3. Genuine stress incontinence.   POSTOPERATIVE DIAGNOSES:  1. Complex endometrial hyperplasia.  2. ASCUS Pap smear suspicious for low grade squamous intraepithelial lesion.  3. Genuine stress incontinence.   PROCEDURE:  1. Total abdominal hysterectomy with bilateral salpingo-oophorectomy.  2. Collection of peritoneal washings.  3. Abdominal Burch procedure with cystoscopy.   SURGEON:  Randye Lobo, M.D.   ASSISTANTLuvenia Redden, MD   ANESTHESIA:  General endotracheal.   IV FLUIDS:  2400 cc Ringer's lactate.   ESTIMATED BLOOD LOSS:  150 cc.   URINE OUTPUT:  200 cc.   COMPLICATIONS:  None.   INDICATIONS FOR PROCEDURE:  The patient was a 77 year old gravida 26, para 2  Caucasian female with complex endometrial hyperplasia without atypia which  was noted at the time of hysteroscopy and D&C on Aug 14, 2001 in follow-up  to a pelvic ultrasound which was requested for the patient's complaint of  abdominal fullness and the question of an abdominopelvic mass.  At the time  of the ultrasound there was thickening of the endometrium measuring 15 mm  with cystic change and blood flow to the region.  The remainder of the  pelvic ultrasound at that time documented a 1.4 cm bilobed _____ ovarian  cyst and a right ovarian remnant measuring 7 mm.  The patient also had a  history of abnormal Pap smears suggestive of low grade squamous  intraepithelial lesion  dating back to 2001.  The patient had previously had  a colposcopy with biopsy which was negative and had been followed with Pap  smears.  Her most recent Pap smear on July 12, 2001 documented ASCUS which  was suspicious, but not diagnostic for low grade SIL.  Because of the  patient's current evaluation and pending treatment for the endometrial  hyperplasia, a decision was made to proceed with definitive surgery instead  of proceeding with a colposcopic evaluation again.   The patient also reported a history of stress incontinence which was  bothersome to her.  Urodynamic testing prior to surgery documented the  presence of genuine stress incontinence.   The patient wished to proceed with definitive surgical evaluation and  treatment of the endometrial hyperplasia, the abnormal Pap smears, and the  urinary incontinence after the risks, benefits, and alternatives were  discussed with her.   FINDINGS:  At the time of laparotomy the patient was noted to have a small,  normal appearing uterus.  The bilateral ovaries had clear vesicular  texturing to the cortex.  The fallopian tubes were noted to be normal.  There was a 1 cm left paraovarian cyst.  There were no peritoneal  excrescences appreciated in the abdomen or in the pelvis.  The omentum was  normal.  The liver edge was noted to be smooth and the gallbladder was  unremarkable.  There were no palpable periaortic or pelvic nodes which were  enlarged.   Cystoscopy at the end of the Burch procedure documented the ureters to be  patent bilaterally after the injection of indigo carmine dye intravenously.  There were no sutures appreciated in the bladder and urethra upon  visualization of the bladder throughout 360 degrees including the bladder  dome and the trigone region.   SPECIMENS:  The uterus, tubes, and ovaries were sent to pathology along with  pelvic washings.   PROCEDURE:  The patient was taken from the preoperative hold area  with an IV  in place after she received Ancef 1 g intravenously for antibiotic  prophylaxis.  The patient did receive both ted hose and compression  stockings for DVT prophylaxis.  The patient was escorted to the operating  room where, again, she was reidentified prior to beginning the surgery.  The  patient did receive general endotracheal anesthesia and was then placed in  the dorsal lithotomy position.  The abdomen and vagina were sterilely  prepped and draped.  Foley catheter placed inside the urinary bladder.   A Pfannenstiel incision was created sharply with the scalpel and carried  down to the fascia using monopolar cautery.  The fascia was then scored in  the midline with the monopolar cautery and the incision was extended using  Mayo scissors.  The rectus muscles were divided in the midline and the  parietoperitoneum was grasped and elevated with two snap clamps.  It was  entered sharply with the Metzenbaum scissors.  The incision was extended  cranially and caudally.  There was some bleeding noted along the peritoneal  edge inferiorly which was responded to monopolar cautery.   A self retaining retractor was then placed to expose the surgical field and  the bowel was packed into the upper abdomen with moistened lap packs.  An  inspection fo the pelvic organs was performed at this time and a decision  was made to proceed with pelvic washings for cytology which were sent to  pathology.  Two long Kelly clamps were then placed across the adnexal  structures bilaterally.  Each of the round ligaments was suture ligated and  then divided with monopolar cautery.  The broad ligaments were entered  bilaterally and the bladder flap was created sharply with the Metzenbaum  scissors.  The infundibulopelvic ligaments were identified bilaterally and a  window was then created to each of the posterior aspects of the broad ligaments prior to clamping and then sharply dividing these vascular   structures.  Two free ties were placed around the right infundibulopelvic  ligament and a free tie followed by a suture ligature was placed around the  left infundibulopelvic ligament.  Hemostasis was excellent.  Uterine  arteries were then skeletonized and clamped and sharply divided before  suture ligating with 0 Vicryl for excellent hemostasis.  The remainder of  the cardinal and then the uterosacral ligaments were clamped and divided  sequentially and then suture ligated with 0  Vicryl.  Curved Heaney clamp was  placed across the top of the vagina bilaterally and the specimen was sharply  excised and sent to pathology.  Heaney sutures were then created for angles  at the top of the vagina and a single figure-of-eight suture of 0 Vicryl was  placed in the mid portion of the vagina to close it completely.   Irrigation of the pelvis was performed at this time and bleeding edges along  peritoneal surfaces along the right and left pelvic side walls then  responded to monopolar cautery.   The surgical field was noted to be hemostatic at this time and the Burch was  performed next.   The self retaining retractor and the lap pads were removed.  A turning  modification of the Pfannenstiel incision was created at this time  bilaterally by taking the rectus muscles off the tendinous insertion along  the pubic symphysis bilaterally.  The space of Retzius was next identified  and dissection was carried down to the level of the arcus tendineus fascia  pelvis bilaterally.  A gloved hand was then placed inside the vagina and  overall elevating the paravaginal tissue from below, the series of four  Burch sutures were placed.  Two sutures were placed on each side.  0  Ethibond double armed suture was brought through the paravaginal tissue to  the right of the urethra and just lateral to it in a figure-of-eight  fashion.  A second figure-of-eight suture of the 0 Ethibond was then placed  just laterally  to this at the level of the urethrovesical junction.  The  same procedure performed on the right was then performed on the left-hand  side.  Each arm of the suture was then brought up through the Cooper's  ligament on the ipsilateral sides and the sutures were tied sequentially  while elevating the paravaginal tissue from below, this creating an  excellent hammock of support underneath the urethra.   Indigo carmine was injected intravenously and cystoscopy was performed next.  The findings are as noted above.   The abdomen was closed at this time.  The parietoperitoneum was closed as a  running suture of 3-0 plain.  The rectus muscles were reinserted onto the  tendinous insertions in the pubic symphysis by placing figure-of-eight  sutures of 0 chromic bilaterally.  The rectus muscles were then brought  together in the midline by placing a figure-of-eight suture of the same. The fascia once again inspected and found to be hemostatic and was therefore  closed with a running suture of 0 Vicryl.  The subcutaneous tissue was  irrigated and suctioned and was closed with interrupted sutures of 3-0  plain.  Staples were then placed on the skin followed by a sterile bandage.   There were no complications to the procedure.  All needle, instrument, and  sponge counts were correct prior to taking the patient to the recovery room  in stable condition.                                               Randye Lobo, M.D.    BES/MEDQ  D:  01/02/2002  T:  01/02/2002  Job:  [id number]

## 2010-08-06 NOTE — H&P (Signed)
NAMEMAILIN, COGLIANESE                          ACCOUNT NO.:  [id number]   MEDICAL RECORD NO.:  [id number]                   PATIENT TYPE:  INP   LOCATION:  NA                                   [facility]AC[facility]   PHYSICIAN:  Randye Lobo, M.D.                DATE OF BIRTH:  October 02, 1930   DATE OF ADMISSION:  01/02/2002  DATE OF DISCHARGE:                                HISTORY & PHYSICAL   CHIEF COMPLAINT:  1. Complex endometrial hyperplasia without atypia.  2. ASCUS Pap smear suspicious for low grade squamous intraepithelial lesion.  3. Genuine stress incontinence.   HISTORY OF PRESENT ILLNESS:  The patient is a 77 year old gravida 46, para 10  Caucasian female with a diagnosis of complex endometrial hyperplasia without  atypia and hyperplastic endometrial polyps noted at the time of hysteroscopy  with D&C on Aug 14, 2001 in follow-up to a pelvic ultrasound which had  documented a thickened endometrium measuring 15 mm with cystic change and  blood flow to that region.  The ultrasound also documented a 1.4 cm bilobed  left ovarian cyst and a right ovarian remnant measuring 7 mm.  As the  patient previously had a Pipelle endometrial biopsy performed in the office  which failed to diagnose the hyperplasia and as the patient was currently  not having any postmenopausal bleeding, an ultrasound was ordered due to  symptoms of abdominal fullness, a decision was made to proceed with surgical  evaluation and treatment of the complex hyperplasia instead of medical  therapy.   The patient does have a history of Pap smears with low grade squamous  intraepithelial lesion dating to the year 2001.  The patient underwent a  normal colposcopy with biopsy performed on August 20, 1999 at which time no  dysplasia was diagnosed.  The patient has been followed with her Pap smears.  The most recent Pap smear was performed on July 12, 2001 at which time she  again had an abnormal Pap smear, this time showing  ASCUS which was  suspicious, but not diagnostic for low grade SIL.  The patient was treated  temporarily with some local Premarin cream.  As the patient was in the  process of evaluation for ultrasound findings shortly thereafter, decision  was made to hold off on further colposcopic evaluation as the patient was  going to undergo definitive surgical treatment for the endometrial  hyperplasia.   The patient also reports a history of waxing and waning genuine stress  incontinence.  The patient did undergo multichannel urodynamic testing on  December 04, 2001 which documented slight detrusor over activity and the  presence of genuine incontinence on the cystometrogram.  The patient did  experience premature filling sensations and was noted to have a low bladder  capacity of 187 cc.  The patient did have a normal pressure flow study.  Her  previous uroflow study could  not be evaluated due to the low volume she  carried in her bladder.  Her postvoid residual with the uroflow study was 0.   PAST OBSTETRIC/GYNECOLOGIC HISTORY:  Remarkable for six prior pregnancies  and four vaginal deliveries.  The patient has a history of one prior  miscarriage.  Her last mammogram was performed on July 12, 2001 and was  noted to be normal.  Her last Pap smear was performed on July 12, 2001 and  was noted to be ASCUS.   PAST MEDICAL HISTORY:  1. Spasmodic torticollis with hyperkinetic dysarthria which had been treated     in the past with cervical Botox injections.  2. Vocal atrophy associated with aging.  3. Hypertension.   PAST SURGICAL HISTORY:  1. Status post stereotactic surgery for right limb dystonia at [facility] in 1972.  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  2. Status post hysteroscopy with D&C at the [facility] on Aug 14, 2001.   MEDICATIONS:  1. Amitriptyline  100 mg p.o. q.h.s.  2. Lotensin/HCT 10/12.5 mg one p.o. q.d.  3. Zyprexa 5 mg one p.o. q.h.s.  4. Lorazepam 1 mg two p.o. t.i.d.   ALLERGIES:  No known drug allergies.   SOCIAL HISTORY:  The patient denies the use of tobacco, alcohol, or  nonprescription drugs.   FAMILY HISTORY:  Noncontributory.   REVIEW OF SYMPTOMS:  The patient reports neck and facial spasms which are  bothersome to her.   PHYSICAL EXAMINATION:  VITAL SIGNS:  Blood pressure 120/72.  GENERAL:  The patient is an elderly Caucasian female who appears to have  facial and neck and chest spasms.  There is some asymmetry noted in her face  and in her left upper extremity due to this muscular activity.  LUNGS:  Clear to auscultation bilaterally.  HEART:  Regular rate and rhythm and no evidence of a murmur, rub, or gallop.  ABDOMEN:  Obese, soft, and nontender.  No masses are appreciated.  PELVIC:  Demonstrates atrophic external genitalia and urethra.  There is a  minimal cystocele.  The vagina has good apical support.  There is no  evidence of a rectocele.  The cervix and vagina demonstrate no lesions.  The  uterus is noted to be small and nontender.  There are no adnexal masses nor  tenderness appreciated.  EXTREMITIES:  In the lower extremities there is no evidence of any edema.  The patellar reflexes are noted to be 2+ bilaterally.   ASSESSMENT:  The patient is a 77 year old gravida 30, para 54 Caucasian female  with complex endometrial hyperplasia without atypia, abnormal Pap smears,  and genuine stress incontinence.   PLAN:  The patient will undergo a total abdominal hysterectomy with  bilateral salpingo-oophorectomy, abdominal Burch procedure, and cystoscopy  at the [facility] on January 02, 2002.  Risks, benefits, and  alternatives have been discussed with the patient and a family member who  chose to proceed.                                               Randye Lobo, M.D.   BES/MEDQ  D:   01/01/2002  T:  01/01/2002  Job:  [id number]

## 2010-08-06 NOTE — H&P (Signed)
[name], [name]                ACCOUNT NO.:  [id number]   MEDICAL RECORD NO.:  [id number]          PATIENT TYPE:  EMS   LOCATION:  ED                           FACILITY:  [facility]   PHYSICIAN:  Valetta Mole. Swords, MD    DATE OF BIRTH:  June 22, 1930   DATE OF ADMISSION:  02/11/2006  DATE OF DISCHARGE:                              HISTORY & PHYSICAL   CHIEF COMPLAINT:  Frequent falls.   HISTORY OF PRESENT ILLNESS:  [name] is a 77 year old female with  significant myoclonus, for which she has sought multiple medical  opinions in the past.  She comes to the hospital today complaining of  frequent falls.  She has fallen 3-5 times over the last 2-3 days.  She  denies any significant trauma.  She does have difficulty getting off of  the floor when she does fall.  She states that she feels like her legs  just give out.  This could be both legs at the same time.  It could be  the right leg or it could the left leg.  This happens multiple times a  day, but she has only fallen 3-5 times.  She denies any loss of  consciousness.  She denies any significant trauma to her back or head.  She denies any associated symptoms or modifying factors that she knows  of.  She denies any causative agents that she knows of.  She has had  symptoms like this before, which have been unexplained in the past.   PAST MEDICAL HISTORY:  Significant myoclonus affecting the left side of  her body and neck.  She has seen multiple physicians for this problem.  She has had botulinum injections by Dr. Thad Ranger.  Her past medical  history is really significant for myoclonus, status post a hysterectomy  and status post a brain surgery, which was directed at the myoclonus  problems.   MEDICATIONS:  She takes two medications.  She cannot remember the names  of them.  The emergency room record states that she is on Klonopin and  Robaxin.   SOCIAL HISTORY:  She lives alone.  She does not smoke.  She does not  drink alcohol.   She does have children who live nearby.   FAMILY HISTORY:  Mother and father are both deceased.  She does not know  what from.   REVIEW OF SYSTEMS:  She denies any chest pain, shortness of breath, PND,  orthopnea, abdominal pain, GI blood loss, or any other complaints in the  complete review of systems.   PHYSICAL EXAMINATION:  VITAL SIGNS:  Temperature 97.9, respirations 20,  pulse 85, blood pressure 135/79.  GENERAL:  She appears as an obese female in no acute distress.  HEENT:  Atraumatic, normocephalic.  Extraocular muscles are intact.  NECK:  Supple without lymphadenopathy, thyromegaly, jugular venous  distention or carotid bruits.  CHEST:  Clear to auscultation.  CARDIAC:  S1 and S2 are regular without gallop.  ABDOMEN:  Obese, active bowel sounds, soft, nontender.  EXTREMITIES:  There is no clubbing, cyanosis or edema.  NEUROLOGIC:  She  is alert and oriented.  Moves all four extremities.  Cranial nerves are intact.  Strength in the lower extremities seem to be  intact.  I did not ask her to walk at this time.   LABORATORIES:  Hemoglobin 11.6.  CMET is normal.  Urinalysis with 0-2  white blood cells per high-powered field.   ASSESSMENT AND PLAN:  Frequent falls, unclear etiology.  I think this is  just worsening debilitation.  She has a walker at home.  Really has been  unable to use it.  She has a wheelchair at home, and she has been unable  to use that.  I think she needs skilled nursing facility for short-term  rehab at least.  I do not think her myoclonus is contributing to the  immediate problem.  She will be admitted because she is unsafe to go  home.  We will ask case manager, PT and OT to see the patient as soon as  possible.      Bruce Rexene Edison Swords, MD  Electronically Signed     BHS/MEDQ  D:  02/12/2006  T:  02/12/2006  Job:  [id number]

## 2010-10-22 ENCOUNTER — Other Ambulatory Visit: Payer: Self-pay | Admitting: Physical Medicine and Rehabilitation

## 2010-10-22 DIAGNOSIS — M549 Dorsalgia, unspecified: Secondary | ICD-10-CM

## 2010-10-26 ENCOUNTER — Inpatient Hospital Stay: Admission: RE | Admit: 2010-10-26 | Payer: Self-pay | Source: Ambulatory Visit

## 2010-12-10 LAB — CBC
HCT: 36
Hemoglobin: 13.4
Platelets: 228
RBC: 3.97
WBC: 6.4

## 2010-12-10 LAB — COMPREHENSIVE METABOLIC PANEL
ALT: 20
AST: 24
Albumin: 3.2 — ABNORMAL LOW
Alkaline Phosphatase: 98
BUN: 12
Chloride: 108
GFR calc Af Amer: >60
Potassium: 4.4
Sodium: 141
Total Bilirubin: 0.6

## 2010-12-10 LAB — URINE MICROSCOPIC-ADD ON

## 2010-12-10 LAB — DIFFERENTIAL
Lymphocytes Relative: 36
Monocytes Absolute: 0.2
Monocytes Relative: 4
Neutro Abs: 3

## 2010-12-10 LAB — URINALYSIS, ROUTINE W REFLEX MICROSCOPIC
Nitrite: NEGATIVE
Specific Gravity, Urine: 1.017
Urobilinogen, UA: 0.2

## 2010-12-10 LAB — BASIC METABOLIC PANEL
Calcium: 9.2
Chloride: 104
GFR calc non Af Amer: >60
Potassium: 3.4 — ABNORMAL LOW

## 2010-12-10 LAB — CARDIAC PANEL(CRET KIN+CKTOT+MB+TROPI)
CK, MB: 5.9 — ABNORMAL HIGH
Relative Index: 4.5 — ABNORMAL HIGH
Troponin I: 0.02

## 2010-12-10 LAB — LACTATE DEHYDROGENASE: LDH: 148

## 2010-12-10 LAB — URINE CULTURE

## 2010-12-10 LAB — CK TOTAL AND CKMB (NOT AT ARMC): Relative Index: INVALID

## 2010-12-10 LAB — POCT CARDIAC MARKERS
CKMB, poc: 1.4
Troponin i, poc: <0.05

## 2010-12-15 LAB — COMPREHENSIVE METABOLIC PANEL
BUN: 9
CO2: 26
Calcium: 8.7
Chloride: 102
Creatinine, Ser: 0.73
GFR calc non Af Amer: >60
Total Bilirubin: 0.5

## 2010-12-15 LAB — URINALYSIS, ROUTINE W REFLEX MICROSCOPIC
Bilirubin Urine: NEGATIVE
Glucose, UA: NEGATIVE
Hgb urine dipstick: NEGATIVE
Ketones, ur: NEGATIVE
Nitrite: POSITIVE — AB
Protein, ur: NEGATIVE
Specific Gravity, Urine: 1.009
Urobilinogen, UA: 0.2
pH: 6.5

## 2010-12-15 LAB — URINE CULTURE: Colony Count: >=100000

## 2010-12-15 LAB — COMPREHENSIVE METABOLIC PANEL WITH GFR
ALT: 26
AST: 27
Albumin: 3.2 — ABNORMAL LOW
Alkaline Phosphatase: 98
GFR calc Af Amer: >60
Glucose, Bld: 117 — ABNORMAL HIGH
Potassium: 3.7
Sodium: 136
Total Protein: 7.7

## 2010-12-15 LAB — URINE MICROSCOPIC-ADD ON

## 2010-12-15 LAB — CBC
HCT: 36.9
Hemoglobin: 13
MCHC: 35.1
MCV: 96.1
Platelets: 251
RBC: 3.84 — ABNORMAL LOW
RDW: 13.2
WBC: 5.5

## 2010-12-15 LAB — DIFFERENTIAL
Basophils Absolute: 0
Basophils Relative: 1
Eosinophils Absolute: 0.1
Eosinophils Relative: 1
Lymphocytes Relative: 37
Lymphs Abs: 2
Monocytes Absolute: 0.2
Monocytes Relative: 4
Neutro Abs: 3.2
Neutrophils Relative %: 58

## 2010-12-15 LAB — CK TOTAL AND CKMB (NOT AT ARMC)
CK, MB: 1.5
Relative Index: INVALID
Total CK: 44

## 2010-12-15 LAB — TROPONIN I: Troponin I: 0.03

## 2011-01-04 LAB — I-STAT 8, (EC8 V) (CONVERTED LAB)
BUN: 17
Bicarbonate: 26.4 — ABNORMAL HIGH
Chloride: 106
HCT: 40
Hemoglobin: 13.6
Operator id: 288831
Sodium: 141

## 2011-01-04 LAB — URINALYSIS, ROUTINE W REFLEX MICROSCOPIC
Hgb urine dipstick: NEGATIVE
Protein, ur: NEGATIVE
Urobilinogen, UA: 1

## 2011-01-04 LAB — URINE CULTURE: Colony Count: >=100000

## 2011-01-04 LAB — DIFFERENTIAL
Basophils Absolute: 0
Lymphocytes Relative: 37
Lymphs Abs: 1.7
Monocytes Absolute: 0.3
Monocytes Relative: 6
Neutro Abs: 2.5

## 2011-01-04 LAB — CLOSTRIDIUM DIFFICILE EIA

## 2011-01-04 LAB — URINE MICROSCOPIC-ADD ON

## 2011-01-04 LAB — CBC
Hemoglobin: 12.8
RBC: 3.91
WBC: 4.5

## 2011-10-04 ENCOUNTER — Encounter: Payer: Self-pay | Admitting: Gastroenterology

## 2011-12-20 ENCOUNTER — Other Ambulatory Visit (HOSPITAL_COMMUNITY): Payer: Self-pay | Admitting: Internal Medicine

## 2011-12-20 DIAGNOSIS — I2699 Other pulmonary embolism without acute cor pulmonale: Secondary | ICD-10-CM

## 2011-12-21 ENCOUNTER — Ambulatory Visit (HOSPITAL_COMMUNITY)
Admission: RE | Admit: 2011-12-21 | Discharge: 2011-12-21 | Disposition: A | Discharge status: Home / Self Care | Payer: Medicare Other | Source: Ambulatory Visit | Attending: Internal Medicine | Admitting: Internal Medicine

## 2011-12-21 DIAGNOSIS — R0602 Shortness of breath: Secondary | ICD-10-CM | POA: Insufficient documentation

## 2011-12-21 DIAGNOSIS — I2699 Other pulmonary embolism without acute cor pulmonale: Secondary | ICD-10-CM

## 2011-12-21 MED ORDER — IOHEXOL 350 MG/ML SOLN
100.0000 mL | Freq: Once | INTRAVENOUS | Status: AC | PRN
  Administered 2011-12-21: 100 mL via INTRAVENOUS

## 2017-05-08 ENCOUNTER — Encounter (HOSPITAL_COMMUNITY): Payer: Self-pay | Admitting: Physician Assistant

## 2017-05-08 ENCOUNTER — Emergency Department (HOSPITAL_COMMUNITY): Payer: Medicare Other

## 2017-05-08 ENCOUNTER — Inpatient Hospital Stay (HOSPITAL_COMMUNITY)
Admission: EM | Admit: 2017-05-08 | Discharge: 2017-05-17 | DRG: 291 | Disposition: A | Discharge status: Skilled Nursing Facility | Payer: Medicare Other | Attending: Internal Medicine | Admitting: Internal Medicine

## 2017-05-08 DIAGNOSIS — Z79899 Other long term (current) drug therapy: Secondary | ICD-10-CM

## 2017-05-08 DIAGNOSIS — D638 Anemia in other chronic diseases classified elsewhere: Secondary | ICD-10-CM | POA: Diagnosis present

## 2017-05-08 DIAGNOSIS — Y95 Nosocomial condition: Secondary | ICD-10-CM | POA: Diagnosis present

## 2017-05-08 DIAGNOSIS — I110 Hypertensive heart disease with heart failure: Principal | ICD-10-CM | POA: Diagnosis present

## 2017-05-08 DIAGNOSIS — L89311 Pressure ulcer of right buttock, stage 1: Secondary | ICD-10-CM | POA: Diagnosis present

## 2017-05-08 DIAGNOSIS — R259 Unspecified abnormal involuntary movements: Secondary | ICD-10-CM | POA: Diagnosis present

## 2017-05-08 DIAGNOSIS — F22 Delusional disorders: Secondary | ICD-10-CM

## 2017-05-08 DIAGNOSIS — J189 Pneumonia, unspecified organism: Secondary | ICD-10-CM

## 2017-05-08 DIAGNOSIS — Z515 Encounter for palliative care: Secondary | ICD-10-CM | POA: Diagnosis not present

## 2017-05-08 DIAGNOSIS — G92 Toxic encephalopathy: Secondary | ICD-10-CM | POA: Diagnosis present

## 2017-05-08 DIAGNOSIS — R0902 Hypoxemia: Secondary | ICD-10-CM | POA: Diagnosis not present

## 2017-05-08 DIAGNOSIS — R0602 Shortness of breath: Secondary | ICD-10-CM

## 2017-05-08 DIAGNOSIS — F319 Bipolar disorder, unspecified: Secondary | ICD-10-CM | POA: Diagnosis present

## 2017-05-08 DIAGNOSIS — E669 Obesity, unspecified: Secondary | ICD-10-CM | POA: Diagnosis present

## 2017-05-08 DIAGNOSIS — Z915 Personal history of self-harm: Secondary | ICD-10-CM

## 2017-05-08 DIAGNOSIS — E876 Hypokalemia: Secondary | ICD-10-CM | POA: Diagnosis not present

## 2017-05-08 DIAGNOSIS — J9622 Acute and chronic respiratory failure with hypercapnia: Secondary | ICD-10-CM | POA: Diagnosis present

## 2017-05-08 DIAGNOSIS — J9621 Acute and chronic respiratory failure with hypoxia: Secondary | ICD-10-CM

## 2017-05-08 DIAGNOSIS — R1310 Dysphagia, unspecified: Secondary | ICD-10-CM | POA: Diagnosis present

## 2017-05-08 DIAGNOSIS — Z6831 Body mass index (BMI) 31.0-31.9, adult: Secondary | ICD-10-CM

## 2017-05-08 DIAGNOSIS — G894 Chronic pain syndrome: Secondary | ICD-10-CM | POA: Diagnosis present

## 2017-05-08 DIAGNOSIS — Z7189 Other specified counseling: Secondary | ICD-10-CM | POA: Diagnosis not present

## 2017-05-08 DIAGNOSIS — F419 Anxiety disorder, unspecified: Secondary | ICD-10-CM | POA: Diagnosis present

## 2017-05-08 DIAGNOSIS — I5033 Acute on chronic diastolic (congestive) heart failure: Secondary | ICD-10-CM | POA: Diagnosis present

## 2017-05-08 DIAGNOSIS — J90 Pleural effusion, not elsewhere classified: Secondary | ICD-10-CM | POA: Diagnosis not present

## 2017-05-08 DIAGNOSIS — Z66 Do not resuscitate: Secondary | ICD-10-CM | POA: Diagnosis present

## 2017-05-08 DIAGNOSIS — G249 Dystonia, unspecified: Secondary | ICD-10-CM | POA: Diagnosis present

## 2017-05-08 DIAGNOSIS — I509 Heart failure, unspecified: Secondary | ICD-10-CM | POA: Diagnosis not present

## 2017-05-08 DIAGNOSIS — T17998A Other foreign object in respiratory tract, part unspecified causing other injury, initial encounter: Secondary | ICD-10-CM

## 2017-05-08 DIAGNOSIS — F209 Schizophrenia, unspecified: Secondary | ICD-10-CM | POA: Diagnosis present

## 2017-05-08 DIAGNOSIS — L8990 Pressure ulcer of unspecified site, unspecified stage: Secondary | ICD-10-CM

## 2017-05-08 DIAGNOSIS — Z7982 Long term (current) use of aspirin: Secondary | ICD-10-CM

## 2017-05-08 DIAGNOSIS — Z9981 Dependence on supplemental oxygen: Secondary | ICD-10-CM

## 2017-05-08 DIAGNOSIS — L89321 Pressure ulcer of left buttock, stage 1: Secondary | ICD-10-CM | POA: Diagnosis present

## 2017-05-08 DIAGNOSIS — I952 Hypotension due to drugs: Secondary | ICD-10-CM | POA: Diagnosis not present

## 2017-05-08 DIAGNOSIS — I10 Essential (primary) hypertension: Secondary | ICD-10-CM | POA: Diagnosis present

## 2017-05-08 DIAGNOSIS — I272 Pulmonary hypertension, unspecified: Secondary | ICD-10-CM | POA: Diagnosis present

## 2017-05-08 DIAGNOSIS — J9601 Acute respiratory failure with hypoxia: Secondary | ICD-10-CM | POA: Diagnosis not present

## 2017-05-08 DIAGNOSIS — I361 Nonrheumatic tricuspid (valve) insufficiency: Secondary | ICD-10-CM | POA: Diagnosis not present

## 2017-05-08 HISTORY — DX: Pneumonia, unspecified organism: J18.9

## 2017-05-08 HISTORY — DX: Dystonia, unspecified: G24.9

## 2017-05-08 HISTORY — DX: Essential (primary) hypertension: I10

## 2017-05-08 LAB — CBC WITH DIFFERENTIAL/PLATELET
BASOS PCT: 0 %
Basophils Absolute: 0 10*3/uL (ref 0.0–0.1)
Eosinophils Absolute: 0.1 10*3/uL (ref 0.0–0.7)
Eosinophils Relative: 1 %
HEMATOCRIT: 32.2 % — AB (ref 36.0–46.0)
HEMOGLOBIN: 9.8 g/dL — AB (ref 12.0–15.0)
LYMPHS PCT: 25 %
Lymphs Abs: 1.1 10*3/uL (ref 0.7–4.0)
MCH: 32.9 pg (ref 26.0–34.0)
MCHC: 30.4 g/dL (ref 30.0–36.0)
MCV: 108.1 fL — AB (ref 78.0–100.0)
MONO ABS: 0.2 10*3/uL (ref 0.1–1.0)
MONOS PCT: 5 %
NEUTROS PCT: 69 %
Neutro Abs: 3.1 10*3/uL (ref 1.7–7.7)
Platelets: 180 10*3/uL (ref 150–400)
RBC: 2.98 MIL/uL — ABNORMAL LOW (ref 3.87–5.11)
RDW: 12.2 % (ref 11.5–15.5)
WBC: 4.5 10*3/uL (ref 4.0–10.5)

## 2017-05-08 LAB — COMPREHENSIVE METABOLIC PANEL
ALBUMIN: 2.6 g/dL — AB (ref 3.5–5.0)
ALT: 12 U/L — ABNORMAL LOW (ref 14–54)
AST: 17 U/L (ref 15–41)
Alkaline Phosphatase: 88 U/L (ref 38–126)
Anion gap: 7 (ref 5–15)
BUN: 9 mg/dL (ref 6–20)
CHLORIDE: 99 mmol/L — AB (ref 101–111)
CO2: 37 mmol/L — AB (ref 22–32)
Calcium: 8.7 mg/dL — ABNORMAL LOW (ref 8.9–10.3)
Creatinine, Ser: 0.49 mg/dL (ref 0.44–1.00)
GFR calc non Af Amer: >60 mL/min (ref >60)
GLUCOSE: 124 mg/dL — AB (ref 65–99)
POTASSIUM: 3.9 mmol/L (ref 3.5–5.1)
SODIUM: 143 mmol/L (ref 135–145)
Total Bilirubin: 0.5 mg/dL (ref 0.3–1.2)
Total Protein: 7.3 g/dL (ref 6.5–8.1)

## 2017-05-08 LAB — INFLUENZA PANEL BY PCR (TYPE A & B)
Influenza A By PCR: NEGATIVE
Influenza B By PCR: NEGATIVE

## 2017-05-08 LAB — I-STAT TROPONIN, ED: Troponin i, poc: 0 ng/mL (ref 0.00–0.08)

## 2017-05-08 MED ORDER — HYDROCODONE-ACETAMINOPHEN 5-325 MG PO TABS: 1.0000 | ORAL_TABLET | ORAL | Status: DC | PRN

## 2017-05-08 MED ORDER — CEFEPIME HCL 1 G IJ SOLR
1.0000 g | Freq: Three times a day (TID) | INTRAMUSCULAR | Status: DC
  Administered 2017-05-09 (×2): 1 g via INTRAVENOUS
  Filled 2017-05-08 (×3): qty 1

## 2017-05-08 MED ORDER — FERROUS SULFATE 325 (65 FE) MG PO TABS
325.0000 mg | ORAL_TABLET | Freq: Every day | ORAL | Status: DC
  Filled 2017-05-08: qty 1

## 2017-05-08 MED ORDER — LACTATED RINGERS IV SOLN: INTRAVENOUS | Status: DC

## 2017-05-08 MED ORDER — ONDANSETRON HCL 4 MG/2ML IJ SOLN
4.0000 mg | Freq: Four times a day (QID) | INTRAMUSCULAR | Status: DC | PRN
  Administered 2017-05-09 – 2017-05-16 (×2): 4 mg via INTRAVENOUS
  Filled 2017-05-08 (×2): qty 2

## 2017-05-08 MED ORDER — ACETAMINOPHEN 325 MG PO TABS: 650.0000 mg | ORAL_TABLET | Freq: Every day | ORAL | Status: DC | PRN

## 2017-05-08 MED ORDER — POLYVINYL ALCOHOL 1.4 % OP SOLN: 2.0000 [drp] | Freq: Three times a day (TID) | OPHTHALMIC | Status: DC | PRN

## 2017-05-08 MED ORDER — ALPRAZOLAM 0.5 MG PO TABS
0.5000 mg | ORAL_TABLET | Freq: Three times a day (TID) | ORAL | Status: DC
  Administered 2017-05-09 (×2): 0.5 mg via ORAL
  Filled 2017-05-08 (×2): qty 1

## 2017-05-08 MED ORDER — OLANZAPINE 10 MG PO TABS: 10.0000 mg | ORAL_TABLET | Freq: Every day | ORAL | Status: DC

## 2017-05-08 MED ORDER — DOCUSATE SODIUM 100 MG PO CAPS
100.0000 mg | ORAL_CAPSULE | Freq: Every day | ORAL | Status: DC
  Administered 2017-05-11 – 2017-05-14 (×3): 100 mg via ORAL
  Filled 2017-05-08 (×3): qty 1

## 2017-05-08 MED ORDER — GABAPENTIN 600 MG PO TABS
1200.0000 mg | ORAL_TABLET | Freq: Every day | ORAL | Status: DC
  Administered 2017-05-09: 1200 mg via ORAL
  Filled 2017-05-08: qty 2

## 2017-05-08 MED ORDER — FUROSEMIDE 20 MG PO TABS
20.0000 mg | ORAL_TABLET | ORAL | Status: DC
  Filled 2017-05-08: qty 1

## 2017-05-08 MED ORDER — ONDANSETRON HCL 4 MG PO TABS: 4.0000 mg | ORAL_TABLET | Freq: Four times a day (QID) | ORAL | Status: DC | PRN

## 2017-05-08 MED ORDER — BISACODYL 10 MG RE SUPP
10.0000 mg | RECTAL | Status: DC | PRN
  Filled 2017-05-08: qty 1

## 2017-05-08 MED ORDER — MORPHINE SULFATE (PF) 4 MG/ML IV SOLN
2.0000 mg | Freq: Once | INTRAVENOUS | Status: AC
  Administered 2017-05-09: 2 mg via INTRAVENOUS
  Filled 2017-05-08: qty 1

## 2017-05-08 MED ORDER — VANCOMYCIN HCL 10 G IV SOLR
1500.0000 mg | Freq: Once | INTRAVENOUS | Status: AC
  Administered 2017-05-08: 1500 mg via INTRAVENOUS
  Filled 2017-05-08: qty 1500

## 2017-05-08 MED ORDER — MUSCLE RUB 10-15 % EX CREA
1.0000 "application " | TOPICAL_CREAM | Freq: Three times a day (TID) | CUTANEOUS | Status: DC | PRN
  Administered 2017-05-13 – 2017-05-16 (×2): 1 via TOPICAL
  Filled 2017-05-08: qty 85

## 2017-05-08 MED ORDER — ASPIRIN 81 MG PO CHEW
81.0000 mg | CHEWABLE_TABLET | Freq: Every day | ORAL | Status: DC
Start: 2017-05-09 — End: 2017-05-10
  Administered 2017-05-09: 81 mg via ORAL
  Filled 2017-05-08: qty 1

## 2017-05-08 MED ORDER — OLANZAPINE 2.5 MG PO TABS: 2.5000 mg | ORAL_TABLET | Freq: Every day | ORAL | Status: DC

## 2017-05-08 MED ORDER — OLANZAPINE 2.5 MG PO TABS
12.5000 mg | ORAL_TABLET | Freq: Every day | ORAL | Status: DC
  Administered 2017-05-09: 21:00:00 12.5 mg via ORAL
  Filled 2017-05-08 (×2): qty 1

## 2017-05-08 MED ORDER — ACETAMINOPHEN 500 MG PO TABS
1000.0000 mg | ORAL_TABLET | Freq: Two times a day (BID) | ORAL | Status: DC
  Administered 2017-05-09: 1000 mg via ORAL
  Filled 2017-05-08: qty 2

## 2017-05-08 MED ORDER — GABAPENTIN 400 MG PO CAPS: 700.0000 mg | ORAL_CAPSULE | Freq: Two times a day (BID) | ORAL | Status: DC

## 2017-05-08 MED ORDER — ENOXAPARIN SODIUM 40 MG/0.4ML ~~LOC~~ SOLN
40.0000 mg | SUBCUTANEOUS | Status: DC
  Administered 2017-05-10 – 2017-05-16 (×7): 40 mg via SUBCUTANEOUS
  Filled 2017-05-08 (×8): qty 0.4

## 2017-05-08 MED ORDER — OLANZAPINE 2.5 MG PO TABS
2.5000 mg | ORAL_TABLET | Freq: Every day | ORAL | Status: DC
  Administered 2017-05-09: 2.5 mg via ORAL
  Filled 2017-05-08 (×2): qty 1

## 2017-05-08 MED ORDER — GABAPENTIN 100 MG PO CAPS: 100.0000 mg | ORAL_CAPSULE | Freq: Two times a day (BID) | ORAL | Status: DC

## 2017-05-08 MED ORDER — POTASSIUM CHLORIDE CRYS ER 10 MEQ PO TBCR
10.0000 meq | EXTENDED_RELEASE_TABLET | Freq: Every day | ORAL | Status: DC
  Administered 2017-05-09: 10 meq via ORAL
  Filled 2017-05-08: qty 1

## 2017-05-08 MED ORDER — MELATONIN 1 MG PO TABS: 2.0000 mg | ORAL_TABLET | Freq: Every day | ORAL | Status: DC

## 2017-05-08 MED ORDER — POLYETHYLENE GLYCOL 3350 17 G PO PACK: 17.0000 g | PACK | Freq: Two times a day (BID) | ORAL | Status: DC

## 2017-05-08 MED ORDER — SODIUM CHLORIDE 0.9 % IV SOLN
2.0000 g | Freq: Once | INTRAVENOUS | Status: AC
  Administered 2017-05-08: 2 g via INTRAVENOUS
  Filled 2017-05-08: qty 2

## 2017-05-08 MED ORDER — IPRATROPIUM-ALBUTEROL 0.5-2.5 (3) MG/3ML IN SOLN
RESPIRATORY_TRACT | Status: AC
  Filled 2017-05-08: qty 3

## 2017-05-08 MED ORDER — VANCOMYCIN HCL 10 G IV SOLR
1250.0000 mg | INTRAVENOUS | Status: DC
  Filled 2017-05-08: qty 1250

## 2017-05-08 MED ORDER — MELATONIN 3 MG PO TABS
3.0000 mg | ORAL_TABLET | Freq: Every day | ORAL | Status: DC
  Administered 2017-05-09: 3 mg via ORAL
  Filled 2017-05-08 (×2): qty 1

## 2017-05-08 MED ORDER — VANCOMYCIN HCL IN DEXTROSE 1-5 GM/200ML-% IV SOLN
1000.0000 mg | Freq: Once | INTRAVENOUS | Status: DC
  Filled 2017-05-08: qty 200

## 2017-05-08 MED ORDER — PROPRANOLOL HCL 10 MG PO TABS
10.0000 mg | ORAL_TABLET | Freq: Two times a day (BID) | ORAL | Status: DC
  Administered 2017-05-09: 10 mg via ORAL
  Filled 2017-05-08 (×3): qty 1

## 2017-05-08 MED ORDER — IPRATROPIUM-ALBUTEROL 0.5-2.5 (3) MG/3ML IN SOLN
3.0000 mL | Freq: Three times a day (TID) | RESPIRATORY_TRACT | Status: DC
  Administered 2017-05-08 – 2017-05-09 (×2): 3 mL via RESPIRATORY_TRACT
  Filled 2017-05-08: qty 3

## 2017-05-08 NOTE — ED Provider Notes (Signed)
Emergency Department Provider Note   I have reviewed the triage vital signs and the nursing notes.   HISTORY  Chief Complaint Shortness of Breath   HPI [name] is a 84 y.o. female with PMH of HTN and dystonia to the emergency department for evaluation of worsening shortness of breath and hypoxemia.  Patient was diagnosed with pneumonia 4 days prior at the SNF and started on Levaquin.  She is been taking this medication but has become more short of breath.  EMS was called today when the patient was noted to have low oxygen saturation on regular rounding.  The patient does use oxygen at night and as needed during the day (baseline 2L PRN [place]).  She denies fevers or shaking chills.  She has continued to have mildly productive cough and some shortness of breath.  She reports occasional choking while eating but no significant event recently.   Past Medical History:  Diagnosis Date  . Dystonia   . HTN (hypertension)   . Pneumonia     Patient Active Problem List   Diagnosis Date Noted  . Acute respiratory failure with hypoxia ([place]) 05/08/2017  . HCAP (healthcare-associated pneumonia) 05/08/2017  . Pleural effusion 05/08/2017  . Dystonia 05/08/2017  . FLATULENCE-GAS-BLOATING 03/04/2010  . PERSONAL HX COLONIC POLYPS 03/04/2010  . BACK PAIN 07/10/2008  . MONOCLONAL GAMMOPATHY 11/03/2006  . HEMATURIA 11/03/2006  . Abnormal involuntary movement 11/03/2006  . FX CLOSED VERTEBRA NOS 11/03/2006  . Essential hypertension 09/21/2006    Allergies Patient has no allergy information on record.  Family History  Family history unknown: Yes    Social History Social History   Tobacco Use  . Smoking status: Never Smoker  . Smokeless tobacco: Never Used  Substance Use Topics  . Alcohol use: No    Frequency: Never  . Drug use: No    Review of Systems  Constitutional: No fever/chills Eyes: No visual changes. ENT: No sore throat. Cardiovascular: Denies chest  pain. Respiratory: Positive shortness of breath. Gastrointestinal: No abdominal pain.  No nausea, no vomiting.  No diarrhea.  No constipation. Genitourinary: Negative for dysuria. Musculoskeletal: Negative for back pain. Skin: Negative for rash. Neurological: Negative for headaches, focal weakness or numbness.  10-point ROS otherwise negative.  ____________________________________________   PHYSICAL EXAM:  VITAL SIGNS: ED Triage Vitals  Enc Vitals Group     BP 05/08/17 1100 116/64     Pulse Rate 05/08/17 1100 93     Resp 05/08/17 1100 (!) 27     Temp 05/08/17 1100 98.1 F (36.7 C)     SpO2 05/08/17 1043 (!) 89 %   Constitutional: Alert and oriented. Chronically ill-appearing but no acute distress.  Eyes: Conjunctivae are normal. Head: Atraumatic. Nose: No congestion/rhinnorhea. Mouth/Throat: Mucous membranes are moist. Neck: No stridor.  Cardiovascular: Normal rate, regular rhythm. Good peripheral circulation. Grossly normal heart sounds.   Respiratory: Increased respiratory effort.  No retractions. Lungs CTAB. Gastrointestinal: Soft and nontender. No distention.  Musculoskeletal: No lower extremity tenderness nor edema. No gross deformities of extremities. Neurologic:  Normal speech and language. No gross focal neurologic deficits are appreciated.  Skin:  Skin is warm, dry and intact. No rash noted.  ____________________________________________   LABS (all labs ordered are listed, but only abnormal results are displayed)  Labs Reviewed  COMPREHENSIVE METABOLIC PANEL - Abnormal; Notable for the following components:      Result Value   Chloride 99 (*)    CO2 37 (*)    Glucose, Bld  124 (*)    Calcium 8.7 (*)    Albumin 2.6 (*)    ALT 12 (*)    All other components within normal limits  CBC WITH DIFFERENTIAL/PLATELET - Abnormal; Notable for the following components:   RBC 2.98 (*)    Hemoglobin 9.8 (*)    HCT 32.2 (*)    MCV 108.1 (*)    All other components  within normal limits  CULTURE, BLOOD (ROUTINE X 2)  CULTURE, BLOOD (ROUTINE X 2)  CULTURE, BLOOD (ROUTINE X 2)  CULTURE, BLOOD (ROUTINE X 2)  GRAM STAIN  BODY FLUID CULTURE  INFLUENZA PANEL BY PCR (TYPE A & B)  BRAIN NATRIURETIC PEPTIDE  HIV ANTIBODY (ROUTINE TESTING)  STREP PNEUMONIAE URINARY ANTIGEN  CBC  CREATININE, SERUM  CBC  BASIC METABOLIC PANEL  ALBUMIN, PLEURAL OR PERITONEAL FLUID  LACTATE DEHYDROGENASE, PLEURAL OR PERITONEAL FLUID  PROTEIN, PLEURAL OR PERITONEAL FLUID  GLUCOSE, PLEURAL OR PERITONEAL FLUID  LACTATE DEHYDROGENASE  I-STAT TROPONIN, ED  CYTOLOGY - NON PAP   ____________________________________________  EKG    EKG Interpretation  Date/Time:  Monday May 08 2017 10:52:01 EST Ventricular Rate:  89 PR Interval:    QRS Duration: 90 QT Interval:  347 QTC Calculation: 423 R Axis:   99 Text Interpretation:  Sinus rhythm Right axis deviation Consider left ventricular hypertrophy No STEMI.  Confirmed by Nanda Quinton [phone]) on 05/08/2017 11:02:27 AM       ____________________________________________  RADIOLOGY  Dg Chest 2 View  Result Date: 05/08/2017 CLINICAL DATA:  Shortness of breath, hypoxemia. EXAM: CHEST  2 VIEW COMPARISON:  CT scan of December 21, 2011. Radiographs of Aug 05, 2008. FINDINGS: Moderate cardiomegaly is noted. No pneumothorax is noted. Minimal right basilar subsegmental atelectasis is noted. Left basilar opacity is noted which may represent atelectasis or infiltrate with associated pleural effusion. Old left rib fractures are noted. Degenerative changes are seen involving both shoulders. IMPRESSION: Moderate cardiomegaly. Left basilar opacity is noted concerning for atelectasis or infiltrate with associated pleural effusion. Electronically Signed   By: Marijo Conception, M.D.   On: 05/08/2017 13:41    ____________________________________________   PROCEDURES  Procedure(s) performed:   Procedures  CRITICAL CARE Performed by:  Margette Fast Total critical care time: 35 minutes Critical care time was exclusive of separately billable procedures and treating other patients. Critical care was necessary to treat or prevent imminent or life-threatening deterioration. Critical care was time spent personally by me on the following activities: development of treatment plan with patient and/or surrogate as well as nursing, discussions with consultants, evaluation of patient's response to treatment, examination of patient, obtaining history from patient or surrogate, ordering and performing treatments and interventions, ordering and review of laboratory studies, ordering and review of radiographic studies, pulse oximetry and re-evaluation of patient's condition.  Nanda Quinton, MD Emergency Medicine  ____________________________________________   INITIAL IMPRESSION / ASSESSMENT AND PLAN / ED COURSE  Pertinent labs & imaging results that were available during my care of the patient were reviewed by me and considered in my medical decision making (see chart for details).  Patient presents to the emergency department for evaluation of difficulty breathing with hypoxemia.  She is being treated for pneumonia with Levaquin for the past 5 days with no improvement.  Patient's oxygen level on scene with EMS is reported as 81%.  She is currently low 90s on 4 L nasal cannula with slight increased work of breathing.  Plan for repeat x-ray, labs, and flu testing.  O2 continues to hold with 4L [place] O2. PNA and pleural effusion on CXR. No recent studies for comparison. Unclear with PNA vs pleural effusion is causing symptoms especially in the setting of no improvement on Levaquin x 5 days at [facility]. Discussed results with the patient covering for HCAP and will admit to hospitalist service.   Discussed patient's case with Hospitalist to request admission. Patient and family (if present) updated with plan. Care transferred to Hospitalist service.  I  reviewed all nursing notes, vitals, pertinent old records, EKGs, labs, imaging (as available).  ____________________________________________  FINAL CLINICAL IMPRESSION(S) / ED DIAGNOSES  Final diagnoses:  Hypoxia  SOB (shortness of breath)  HCAP (healthcare-associated pneumonia)     MEDICATIONS GIVEN DURING THIS VISIT:  Medications  vancomycin (VANCOCIN) 1,500 mg in sodium chloride 0.9 % 500 mL IVPB (not administered)  vancomycin (VANCOCIN) 1,250 mg in sodium chloride 0.9 % 250 mL IVPB (not administered)  ceFEPIme (MAXIPIME) 1 g in sodium chloride 0.9 % 100 mL IVPB (not administered)  acetaminophen (TYLENOL) tablet 650 mg (not administered)  acetaminophen (TYLENOL) tablet 1,000 mg (not administered)  ALPRAZolam (XANAX) tablet 0.5 mg (not administered)  aspirin chewable tablet 81 mg (not administered)  bisacodyl (DULCOLAX) suppository 10 mg (not administered)  Camphor-Menthol-Methyl Sal 08-21-12 % CREA 1 application (not administered)  docusate sodium (COLACE) capsule 100 mg (not administered)  ferrous sulfate tablet 325 mg (not administered)  furosemide (LASIX) tablet 20 mg (not administered)  gabapentin (NEURONTIN) tablet 1,200 mg (not administered)  gabapentin (NEURONTIN) capsule 100 mg (not administered)  polyvinyl alcohol (LIQUIFILM TEARS) 1.4 % ophthalmic solution 2 drop (not administered)  ipratropium-albuterol (DUONEB) 0.5-2.5 (3) MG/3ML nebulizer solution 3 mL (not administered)  Melatonin TABS 2 mg (not administered)  OLANZapine (ZYPREXA) tablet 2.5 mg (not administered)  OLANZapine (ZYPREXA) tablet 10 mg (not administered)  OLANZapine (ZYPREXA) tablet 2.5 mg (not administered)  polyethylene glycol (MIRALAX / GLYCOLAX) packet 17 g (not administered)  potassium chloride (K-DUR,KLOR-CON) CR tablet 10 mEq (not administered)  propranolol (INDERAL) tablet 10 mg (not administered)  enoxaparin (LOVENOX) injection 40 mg (not administered)  HYDROcodone-acetaminophen  (NORCO/VICODIN) 5-325 MG per tablet 1-2 tablet (not administered)  ondansetron (ZOFRAN) tablet 4 mg (not administered)    Or  ondansetron (ZOFRAN) injection 4 mg (not administered)  ceFEPIme (MAXIPIME) 2 g in sodium chloride 0.9 % 100 mL IVPB (0 g Intravenous Stopped 05/08/17 1522)    Note:  This document was prepared using Dragon voice recognition software and may include unintentional dictation errors.  Nanda Quinton, MD Emergency Medicine    Linford Quintela, [facility], MD 05/08/17 [id number]

## 2017-05-08 NOTE — H&P (Signed)
History and Physical    [name] DOB: 10-23-1930 DOA: 05/08/2017  PCP: Leanna Battles, MD Consultants: none Patient coming from: SNF   Chief Complaint: worsening SOB, Acute respiratory distress with O2 desaturation  HPI: [name] is a 84 y.o. 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   At baseline patient is using supplemental oxygen 2 L/min [employer], but due to worsening of shortness of breath was increased to 4 L and still patient was not comfortable. She denied chest pain, chills, but still has productive cough  ED Course: on arrival to the ED VS revealed normal temperature, heart rate 93 respirations 27 blood pressure 116/94 but O2 saturations was in 81% on 2 L of oxygen. Chest x-ray showed cardiomegaly, left basilar opacity and large left pleural effusion Blood work was notable for hemoglobin 9.8 CO2 37, normal renal function. EKG unremarkable Was placed on IV antibiotics by ED provider for H And we were asked to see the patient for admission  Review of Systems: As per HPI; otherwise review of systems reviewed and negative.   Ambulatory Status: wheelchair bound  PMH -dystonia, hypertension, pneumonia, chronic back pain, L3 compression fracture  Family history - patient cannot provide family history and the family at bedside did not know it  Social History   Socioeconomic History  . Marital status: Single    Spouse name: Not on file  . Number of children: Not on file  . Years of education: Not on file  . Highest education level: Not on file    Social Needs  . Financial resource strain: Not on file  . Food insecurity - worry: Not on file  . Food insecurity - inability: Not on file  . Transportation needs - medical: Not on file  . Transportation needs - non-medical: Not on file  Occupational History  . Not on file  Tobacco Use  . Smoking status: Never Smoker  . Smokeless tobacco: Never Used  Substance and Sexual Activity  . Alcohol use: No    Frequency: Never  . Drug use: No  . Sexual activity: Not on file  Other Topics Concern  . Not on file  Social History Narrative  . Not on file      Prior to Admission medications   Medication Sig Start Date End Date Taking? Authorizing Provider  acetaminophen (TYLENOL) 325 MG tablet Take 650 mg by mouth daily as needed for fever.   Yes [provider]  acetaminophen (TYLENOL) 500 MG tablet Take 1,000 mg by mouth 2 (two) times daily.   Yes [provider]  albuterol (PROVENTIL) (2.5 MG/3ML) 0.083% nebulizer solution Take 2.5 mg by nebulization every 4 (four) hours as needed for wheezing or shortness of breath.   Yes [provider]  ALPRAZolam Duanne Moron) 0.5 MG tablet Take 0.5 mg by mouth 3 (three) times daily.   Yes [provider]  aspirin 81 MG chewable tablet Chew 81 mg by mouth daily.   Yes [provider]  bisacodyl (DULCOLAX) 10 MG suppository Place 10 mg rectally as needed for mild constipation or moderate constipation.   Yes [provider]  Camphor-Menthol-Methyl Sal (  TIGER BALM MUSCLE RUB) 05-23-13 % CREA Apply 1 application topically 3 (three) times daily as needed (to lower back three times daily).   Yes [provider]  Cholecalciferol (VITAMIN D3) 50000 units TABS Take 50,000 Units by mouth once a week.   Yes [provider]  Cranberry 450 MG TABS Take 450 mg by mouth 2 (two) times daily.   Yes [provider]  docusate sodium (COLACE) 100 MG capsule Take 100 mg by mouth at bedtime. Hold for  loose bowel movement   Yes [provider]  ferrous sulfate 325 (65 FE) MG tablet Take 325 mg by mouth daily with breakfast.   Yes [provider]  furosemide (LASIX) 20 MG tablet Take 20 mg by mouth every Monday, Wednesday, and Friday.   Yes [provider]  gabapentin (NEURONTIN) 100 MG capsule Take 100 mg by mouth 2 (two) times daily. 9am and 12noon   Yes [provider]  gabapentin (NEURONTIN) 600 MG tablet Take 1,200 mg by mouth at bedtime.   Yes [provider]  gabapentin (NEURONTIN) 600 MG tablet Take 600 mg by mouth 2 (two) times daily. 9am and 12noon   Yes [provider]  Hypromellose (ARTIFICIAL TEARS OP) Place 2 drops into both eyes every 8 (eight) hours as needed (dryness).   Yes [provider]  ipratropium-albuterol (DUONEB) 0.5-2.5 (3) MG/3ML SOLN Take 3 mLs by nebulization every 8 (eight) hours.   Yes [provider]  levofloxacin (LEVAQUIN) 750 MG tablet Take 750 mg by mouth daily.   Yes [provider]  Melatonin 1 MG TABS Take 2 mg by mouth at bedtime.   Yes [provider]  OLANZapine (ZYPREXA) 10 MG tablet Take 10 mg by mouth at bedtime. Give with 2.5mg =12.5mg    Yes [provider]  OLANZapine (ZYPREXA) 2.5 MG tablet Take 2.5 mg by mouth daily. 9am   Yes [provider]  OLANZapine (ZYPREXA) 5 MG tablet Take 2.5 mg by mouth at bedtime.   Yes [provider]  orlistat (ALLI) 60 MG capsule Take 60 mg by mouth every Monday, Wednesday, and Friday.   Yes [provider]  phenylephrine-shark liver oil-mineral oil-petrolatum (PREPARATION H) 0.25-3-14-71.9 % rectal ointment Place 1 application rectally daily as needed for hemorrhoids.   Yes [provider]  polyethylene glycol (MIRALAX / GLYCOLAX) packet Take 17 g by mouth 2 (two) times daily. Hold for loose stool   Yes [provider]  potassium chloride (MICRO-K) 10 MEQ CR capsule Take 10 mEq  by mouth every Monday, Wednesday, and Friday.   Yes [provider]  propranolol (INDERAL) 10 MG tablet Take 10 mg by mouth 2 (two) times daily.   Yes [provider]  senna (SENOKOT) 8.6 MG TABS tablet Take 1 tablet by mouth 2 (two) times daily.   Yes [provider]  vitamin B-12 (CYANOCOBALAMIN) 500 MCG tablet Take 1,000 mcg by mouth daily.   Yes [provider]    Physical Exam: Vitals:   05/08/17 1043 05/08/17 1100 05/08/17 1400  BP:  116/64   Pulse:  93   Resp:  (!) 27   Temp:  98.1 F (36.7 C)   SpO2: (!) 89% 93%   Weight:   88.9 kg (196 lb)  Height:   5\' 3"  (1.6 m)     General:  Appears calm and comfortable Eyes: PERRLA, sckerae unicteric, EOM intact,  ENT: normal external ear canals, oral mucous membranes moist and intact, no nasal  discharge Neck: no lympnadenopathy, masses or thyromegaly Cardiovascular: RRR, no murmurs, gallops or rubs, no LE edema. Respiratory: CTA bilaterally, no adventitious sounds auscultated. Normal respiratory effort. Abdomen: soft, NT / ND, BS (+) 4, no masses or organomegaly appreciated Skin: no rash or induration seen on limited exam Musculoskeletal: no joint deformities observed, left upper extremity with increased tone Psychiatric: Alert and oriented x3, speech is very dysarthric Neurologic: CN II-XII grossly normal, no focal deficit visualized, left hand with resting tremor         Radiological Exams on Admission: Dg Chest 2 View  Result Date: 05/08/2017 CLINICAL DATA:  Shortness of breath, hypoxemia. EXAM: CHEST  2 VIEW COMPARISON:  CT scan of December 21, 2011. Radiographs of Aug 05, 2008. FINDINGS: Moderate cardiomegaly is noted. No pneumothorax is noted. Minimal right basilar subsegmental atelectasis is noted. Left basilar opacity is noted which may represent atelectasis or infiltrate with associated pleural effusion. Old left rib fractures are noted. Degenerative changes are seen involving both  shoulders. IMPRESSION: Moderate cardiomegaly. Left basilar opacity is noted concerning for atelectasis or infiltrate with associated pleural effusion. Electronically Signed   By: Marijo Conception, M.D.   On: 05/08/2017 13:41    EKG: Independently reviewed.  NSR with rate 76; nonspecific ST changes with no evidence of acute ischemia   Labs on Admission: I have personally reviewed the available labs and imaging studies at the time of the admission.    Assessment/Plan Principal Problem:   Acute respiratory failure with hypoxia (HCC) Active Problems:   Essential hypertension   Abnormal involuntary movement   HCAP (healthcare-associated pneumonia)   Pleural effusion   Dystonia    Acute respiratory failure with hypoxia - likely associated with underlying healthcare associated pneumonia and pleural effusion Patient has been recently treated with Levaquin but apparently did not respond well to therapy We will continue broad-spectrum IV antibiotic coverage for each IR consult requested for consideration of thoracentesis Will follow respiratory culture and blood culture and adjust antibiotics as needed  Hypertension - blood pressure seems to be controlled well, continue Inderal and current lasix doses and follow BP Meds will be adjusted based on the BP reading  Dystonia  - shunt has been diagnosed with it in her 26s and the problem has been followed by an [facility] She seems to be well adjusted, able to eat a regular diet, has no problems swallowing, does not complain of muscle pain very spasms Continue to monitor and if needed to be on muscle relaxers as needed   DVT prophylaxis: Lovenox  Code Status: DNR - confirmed with patient/family Family Communication: at bedside Disposition Plan: SNF Consults called: IR Admission status: inpatient    York Grice PA-C [phone] [facility]  If note is complete, please contact covering daytime or nighttime  physician. [email or web address] Password [facility]  05/08/2017, 6:29 PM

## 2017-05-08 NOTE — ED Notes (Signed)
Admitting paged d/t pt's decrease in O2 sats on neb tx

## 2017-05-08 NOTE — ED Triage Notes (Addendum)
Pt to ED via [facility] from [facility] with c/o shortness of breath and hypoxia -- recently dx with pneumonia and UTI. Pt is alert/oriented x 4 -- hx of stroke-- with left sided facial droop -- resp unlabored-- O2 4.5L/m/[place]

## 2017-05-08 NOTE — ED Notes (Signed)
Pt having increased work of breathing. Doctor paged, spoke with hospitalist about new orders and holding lactated ringers due to labored breathing.

## 2017-05-08 NOTE — Progress Notes (Signed)
Pharmacy Antibiotic Note  [name] is a 84 y.o. female admitted on 05/08/2017 with pneumonia.  Pharmacy has been consulted for vancomycin and cefepime dosing. Renal function wnl.  Vancomycin trough goal 15-20  Plan: 1) Vancomycin 1500mg  IV x 1 then 1250mg  IV q24 2) Cefepime 2g IV x 1 then 1g IV q8 3) Follow renal function, cultures, LOT, level if needed  Height: 5\' 3"  (160 cm) Weight: 196 lb (88.9 kg) IBW/kg (Calculated) : 52.4  Temp (24hrs), Avg:98.1 F (36.7 C), Min:98.1 F (36.7 C), Max:98.1 F (36.7 C)  Recent Labs  Lab 05/08/17 1139  WBC 4.5  CREATININE 0.49    Estimated Creatinine Clearance: 55.4 mL/min (by C-G formula based on SCr of 0.49 mg/dL).    Not on File  Antimicrobials this admission: 2/18 Vancomycin >> 2/18 Cefepime >>  Dose adjustments this admission: n/a  Microbiology results: 2/18 blood cx >>  Thank you for allowing pharmacy to be a part of this patient's care.  Deboraha Sprang 05/08/2017 2:30 PM

## 2017-05-09 ENCOUNTER — Inpatient Hospital Stay (HOSPITAL_COMMUNITY): Payer: Medicare Other

## 2017-05-09 LAB — CBC
HCT: 32.2 % — ABNORMAL LOW (ref 36.0–46.0)
Hemoglobin: 9.9 g/dL — ABNORMAL LOW (ref 12.0–15.0)
MCH: 32.5 pg (ref 26.0–34.0)
MCHC: 30.7 g/dL (ref 30.0–36.0)
MCV: 105.6 fL — ABNORMAL HIGH (ref 78.0–100.0)
PLATELETS: 167 10*3/uL (ref 150–400)
RBC: 3.05 MIL/uL — ABNORMAL LOW (ref 3.87–5.11)
RDW: 12.1 % (ref 11.5–15.5)
WBC: 4.7 10*3/uL (ref 4.0–10.5)

## 2017-05-09 LAB — BASIC METABOLIC PANEL
Anion gap: 11 (ref 5–15)
BUN: 6 mg/dL (ref 6–20)
CO2: 34 mmol/L — AB (ref 22–32)
Calcium: 8.6 mg/dL — ABNORMAL LOW (ref 8.9–10.3)
Chloride: 97 mmol/L — ABNORMAL LOW (ref 101–111)
Creatinine, Ser: 0.44 mg/dL (ref 0.44–1.00)
GFR calc non Af Amer: >60 mL/min (ref >60)
Glucose, Bld: 125 mg/dL — ABNORMAL HIGH (ref 65–99)
Potassium: 3.8 mmol/L (ref 3.5–5.1)
SODIUM: 142 mmol/L (ref 135–145)

## 2017-05-09 LAB — LACTATE DEHYDROGENASE: LDH: 133 U/L (ref 98–192)

## 2017-05-09 LAB — PROCALCITONIN

## 2017-05-09 MED ORDER — LIDOCAINE 2% (20 MG/ML) 5 ML SYRINGE
INTRAMUSCULAR | Status: AC
  Filled 2017-05-09: qty 10

## 2017-05-09 MED ORDER — IPRATROPIUM-ALBUTEROL 0.5-2.5 (3) MG/3ML IN SOLN
3.0000 mL | Freq: Three times a day (TID) | RESPIRATORY_TRACT | Status: DC
  Administered 2017-05-10 – 2017-05-12 (×6): 3 mL via RESPIRATORY_TRACT
  Filled 2017-05-09 (×5): qty 3

## 2017-05-09 MED ORDER — FUROSEMIDE 10 MG/ML IJ SOLN: 40.0000 mg | Freq: Every day | INTRAMUSCULAR | Status: DC

## 2017-05-09 NOTE — Progress Notes (Addendum)
Came from nursing home, attempted to collect PCR nostril swap but pt kept saying "no" and shoving nurse's hands at the same time.

## 2017-05-09 NOTE — Progress Notes (Signed)
SLP Cancellation Note  Patient Details Name: [name] MRN: [id number] DOB: 1930/11/01   Cancelled treatment:       Reason Eval/Treat Not Completed: Patient at procedure or test/unavailable. About to go to a thoracentesis, needs to remain NPO. Will try to f/u in pm.    Iriana Artley, Katherene Ponto 05/09/2017, 9:32 AM

## 2017-05-09 NOTE — Progress Notes (Signed)
Patient brought to radiology department for possible thoracentesis accompanied by her family.  Discussed procedure and answered all questions.  After discussion, patient refused procedure.   MD notified. Patient returned to unit.  No procedure performed.   Brynda Greathouse, MS RD PA-C 11:23 AM

## 2017-05-09 NOTE — Evaluation (Signed)
Clinical/Bedside Swallow Evaluation Patient Details  Name: [name] MRN: [id number] Date of Birth: 1930-12-17  Today's Date: 05/09/2017 Time: SLP Start Time (ACUTE ONLY): 4081 SLP Stop Time (ACUTE ONLY): 1315 SLP Time Calculation (min) (ACUTE ONLY): 30 min  Past Medical History:  Past Medical History:  Diagnosis Date  . Dystonia   . HTN (hypertension)   . 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 Approximately 5 days ago patient was diagnosed with pneumoniaand UTIin the facility and placed on Levaquin.At baseline patient isusing supplemental oxygen 2 L/min [name],but due to worsening of shortness of breath was increased to 4 L and still patient was not comfortable. She denied chest pain, chills,but still has productive cough. Recent chest xray revealed moderate cardiomegaly, left basilar opacity is noted concerning for atelectasis or infiltrate with associated pleural effusion. On exam, she has dystonia with atypical muscle movements of her extremities and facial muscles - this is baseline for her as per her son. Exam shows somewhat diminished breath sounds in the LLL but is otherwise unremarkable. Son also requests BH evaluation d/t mental illness.    Assessment / Plan / Recommendation Clinical Impression  Pt presents with increased risk of aspiration given severe neck pain and inability to maintain any semi-upright posture that would decrease risk of aspiration. SLP, nursing family made multiple attempts to reposition pt to reduce risk of aspiration. On one attempt, pt more upright and trials of thin liquids given. Pt refused intake d/t paranoia  about "something being in the water." Daughter consumed first to demonstrate that nothing was in water. Pt consumed minimal amounts of thin liquids via straw. Audible change in respiration notes but pt unwilling/unable to cognitively/emotionally participate in any further trials to assess airway protection. Pt with increased WOB and emotional response. Daughter present and is consenting to NPO status until further trials can be attempted on 05/10/17. Extensive education and support given, but despite this pt cognitively/emotionally/behaviorally unable to consume enough trials to return to safe diet. ST to follow acutely.    Of note, pt's daughter reports that pt has struggled with pneumonia since December with no known cause (~ 6 to 8 weeks).   SLP Visit Diagnosis: Dysphagia, unspecified (R13.10)    Aspiration Risk  Risk for inadequate nutrition/hydration;Moderate aspiration risk    Diet Recommendation NPO   Medication Administration: Via alternative means    Other  Recommendations Oral Care Recommendations: Oral care QID   Follow up Recommendations Skilled Nursing facility      Frequency and Duration min 2x/week  2 weeks       Prognosis Prognosis for Safe Diet Advancement: Fair Barriers to Reach Goals: Cognitive deficits;Behavior      Swallow Study   General Date of Onset: 05/08/17 HPI: [name] a 84 y.o.femalewith medical history significant 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 Approximately 5 days ago patient was diagnosed with pneumoniaand UTIin the facility and placed on Levaquin.At baseline patient isusing supplemental oxygen 2 L/min [place],but due to worsening of shortness of breath was increased to 4 L  and still  patient was not comfortable. She denied chest pain, chills,but still has productive cough. Recent chest xray revealed moderate cardiomegaly, left basilar opacity is noted concerning for atelectasis or infiltrate with associated pleural effusion. On exam, she has dystonia with atypical muscle movements of her extremities and facial muscles - this is baseline for her as per her son. Exam shows somewhat diminished breath sounds in the LLL but is otherwise unremarkable. Son also requests BH evaluation d/t mental illness.  Type of Study: Bedside Swallow Evaluation Previous Swallow Assessment: none in chart Diet Prior to this Study: NPO Temperature Spikes Noted: No Respiratory Status: Nasal cannula History of Recent Intubation: No Behavior/Cognition: Alert;Uncooperative;Requires cueing;Confused Oral Cavity Assessment: Within Functional Limits Oral Care Completed by SLP: Yes Oral Cavity - Dentition: Dentures, top;Dentures, bottom Self-Feeding Abilities: Total assist Patient Positioning: Upright in bed;Postural control adequate for testing;Postural control interferes with function Baseline Vocal Quality: Normal Volitional Cough: Cognitively unable to elicit Volitional Swallow: Unable to elicit    Oral/Motor/Sensory Function Overall Oral Motor/Sensory Function: Within functional limits   Ice Chips Ice chips: Not tested   Thin Liquid Thin Liquid: Impaired Presentation: Straw Pharyngeal  Phase Impairments: Suspected delayed Swallow;Decreased hyoid-laryngeal movement(audible change in respirations)    Nectar Thick Nectar Thick Liquid: Not tested   Honey Thick Honey Thick Liquid: Not tested   Puree Puree: Not tested   Solid   GO   Solid: Not tested        [name] 05/09/2017,2:08 PM

## 2017-05-09 NOTE — Progress Notes (Signed)
[facility] Progress Note  Patient: [name] KZL:[id number]   PCP: Leanna Battles, MD DOB: [birth year]   DOA: 05/08/2017   DOS: 05/09/2017   Date of Service: the patient was seen and examined on 05/09/2017  Subjective: Patient complains about having some pain in her neck as well as continues to have shortness of breath.  Also complains about having significant anxiety.  No chest pain no abdominal pain no nausea no vomiting.  Brief hospital course: Pt. with PMH of dystonia with spasticity, monoclonal gammopathy, HTN, depression, anxiety, L3 fracture, chronic respiratory failure; admitted on 05/08/2017, presented with complaint of shortness of breath, was found to have acute on chronic hypoxic respiratory failure due to pleural effusion. Currently further plan is continue diuresis.  Assessment and Plan: 1.  Acute on chronic hypoxic respiratory failure. Left-sided pleural effusion. Suspected healthcare associated pneumonia. Acute on chronic diastolic CHF. Patient presents with complaints of shortness of breath as well as hypoxia. Uses 2 L of oxygen at baseline.  Saturating 84% on 2 L at my evaluation. Has left-sided pleural effusion. Was recommended to undergo thoracentesis although refusing right now. There was initial some concern about healthcare associated pneumonia although pro-calcitonin negative, no fever no cough.  Less likely pneumonia and therefore will discontinue antibiotics. Possibility of PE cannot be ruled out although no leg swelling no leg pain.  No chest pain as well. Monitor. Most likely this is acute on chronic diastolic CHF and therefore we will continue with diuresis. Lasix 40 mg daily. Incentive spirometry.  2.  Severe dystonia. Dysphagia. Patient has severe dystonia and does get Botox injections on a regular basis. Did not get any injections in last 6 months.  Generally her duration is every 3 months. Appears to have some dysphagia.  Speech therapy  consulted. Patient unable to pass bedside swallow evaluation. Discussed with patient and the family.  They are willing to take the risk of aspiration to get patient her regular home medication. We will resume all her home medications for now and monitor. Speech therapy will continue to follow-up tomorrow.  3.  Chronic pain syndrome. We will continue current home regimen.  4.  HTN. Continue blood pressure medication.  5.Anemia of chronic disease. H&H stable.  Monitor.  6.Anxiety. Mood disorder. Continuing patient's home regimen. Initially family requested some psychiatric consultation although without observing the patient on her current regimen psychiatry consultation will not be beneficial. Monitor.  Diet: NPO except meds  DVT Prophylaxis: subcutaneous Heparin Advance goals of care discussion: DNR DNI, Had extensive family meeting today. Family was informed regarding palliative care approach, hospice, inpatient hospice versus home hospice. I went over most form with family as well. Patient's POA was not present in the room therefore form was not signed.  Family Communication: family was present at bedside, at the time of interview. The pt provided permission to discuss medical plan with the family. Opportunity was given to ask question and all questions were answered satisfactorily.   Disposition:  Discharge to SNF.  Consultants: none Procedures: none  Antibiotics: Anti-infectives (From admission, onward)   Start     Dose/Rate Route Frequency Ordered Stop   05/09/17 1400  vancomycin (VANCOCIN) 1,250 mg in sodium chloride 0.9 % 250 mL IVPB  Status:  Discontinued     1,250 mg 166.7 mL/hr over 90 Minutes Intravenous Every 24 hours 05/08/17 1429 05/09/17 1420   05/08/17 2200  ceFEPIme (MAXIPIME) 1 g in sodium chloride 0.9 % 100 mL IVPB  Status:  Discontinued  1 g 200 mL/hr over 30 Minutes Intravenous Every 8 hours 05/08/17 1429 05/09/17 1420   05/08/17 1430  vancomycin  (VANCOCIN) 1,500 mg in sodium chloride 0.9 % 500 mL IVPB     1,500 mg 250 mL/hr over 120 Minutes Intravenous  Once 05/08/17 1427 05/08/17 2030   05/08/17 1400  ceFEPIme (MAXIPIME) 2 g in sodium chloride 0.9 % 100 mL IVPB     2 g 200 mL/hr over 30 Minutes Intravenous  Once 05/08/17 1357 05/08/17 1522   05/08/17 1400  vancomycin (VANCOCIN) IVPB 1000 mg/200 mL premix  Status:  Discontinued     1,000 mg 200 mL/hr over 60 Minutes Intravenous  Once 05/08/17 1357 05/08/17 1427       Objective: Physical Exam: Vitals:   05/09/17 1004 05/09/17 1253 05/09/17 1528 05/09/17 1614  BP: 118/75 129/71  134/72  Pulse: 98 96  92  Resp: 20 20  20   Temp: 98.4 F (36.9 C) 98.3 F (36.8 C)  98.8 F (37.1 C)  TempSrc: Oral Oral  Oral  SpO2: 99% 96% 92% 94%  Weight:      Height:        Intake/Output Summary (Last 24 hours) at 05/09/2017 1743 Last data filed at 05/09/2017 0055 Gross per 24 hour  Intake 100 ml  Output -  Net 100 ml   Filed Weights   05/08/17 1400 05/09/17 0129  Weight: 88.9 kg (196 lb) 76.2 kg (167 lb 15.9 oz)   General: Alert, Awake and Oriented to Time, Place and Person. Appear in moderate distress, affect irritable Eyes: PERRL, Conjunctiva normal ENT: Oral Mucosa clear moist. Neck:  Can not assess JVD, no Abnormal Mass Or lumps Cardiovascular: S1 and S2 Present, no Murmur, Peripheral Pulses Present Respiratory: normal respiratory effort, Bilateral Air entry equal and Decreased, no use of accessory muscle, Clear to Auscultation, no Crackles, no wheezes Abdomen: Bowel Sound pesent, Soft and no tenderness, no hernia Skin: no redness, no Rash, no induration Extremities: no Pedal edema, no calf tenderness Neurologic: Grossly no focal neuro deficit. Bilaterally Equal motor strength  Data Reviewed: CBC: Recent Labs  Lab 05/08/17 1139 05/09/17 0827  WBC 4.5 4.7  NEUTROABS 3.1  --   HGB 9.8* 9.9*  HCT 32.2* 32.2*  MCV 108.1* 105.6*  PLT 180 466   Basic Metabolic  Panel: Recent Labs  Lab 05/08/17 1139 05/09/17 0827  NA 143 142  K 3.9 3.8  CL 99* 97*  CO2 37* 34*  GLUCOSE 124* 125*  BUN 9 6  CREATININE 0.49 0.44  CALCIUM 8.7* 8.6*    Liver Function Tests: Recent Labs  Lab 05/08/17 1139  AST 17  ALT 12*  ALKPHOS 88  BILITOT 0.5  PROT 7.3  ALBUMIN 2.6*   No results for input(s): LIPASE, AMYLASE in the last 168 hours. No results for input(s): AMMONIA in the last 168 hours. Coagulation Profile: No results for input(s): INR, PROTIME in the last 168 hours. Cardiac Enzymes: No results for input(s): CKTOTAL, CKMB, CKMBINDEX, TROPONINI in the last 168 hours. BNP (last 3 results) No results for input(s): PROBNP in the last 8760 hours. CBG: No results for input(s): GLUCAP in the last 168 hours. Studies: No results found.  Scheduled Meds: . acetaminophen  1,000 mg Oral BID  . ALPRAZolam  0.5 mg Oral TID  . aspirin  81 mg Oral Daily  . docusate sodium  100 mg Oral QHS  . enoxaparin (LOVENOX) injection  40 mg Subcutaneous Q24H  . ferrous sulfate  325  mg Oral Q breakfast  . [START ON 05/10/2017] furosemide  20 mg Oral Q M,W,F  . gabapentin  700 mg Oral BID  . gabapentin  1,200 mg Oral QHS  . ipratropium-albuterol  3 mL Nebulization Q8H  . Melatonin  3 mg Oral QHS  . OLANZapine  12.5 mg Oral QHS  . OLANZapine  2.5 mg Oral Daily  . polyethylene glycol  17 g Oral BID  . potassium chloride  10 mEq Oral Daily  . propranolol  10 mg Oral BID   Continuous Infusions: PRN Meds: acetaminophen, bisacodyl, HYDROcodone-acetaminophen, MUSCLE RUB, ondansetron **OR** ondansetron (ZOFRAN) IV, polyvinyl alcohol  Time spent: 45 minutes  Author: Berle Mull, MD [facility] Pager: [phone] 05/09/2017 5:43 PM  If 7PM-7AM, please contact night-coverage at [email or web address], password [email or web address]

## 2017-05-09 NOTE — Progress Notes (Signed)
Pts daughter asking about parameters and treatment with a patient who has a DNR. Family wanting patient to continue with treatments but not resuscitation. CM notified MD and he will meet with the family. CM left MOST form in the room for the MD if he needs. CM following.

## 2017-05-09 NOTE — Progress Notes (Signed)
Arrived from ED at 0100. Alert and oriented x3, not situation. Generalized pain 3/10. Soft spoken. Oriented to room, call light within reach. Need reinforcement with education.

## 2017-05-09 NOTE — Progress Notes (Signed)
Refused lab work. Educated on the importance of getting it done, pt stated " I haven't made up my mind yet." "I wait for the doctor to do it."

## 2017-05-10 ENCOUNTER — Inpatient Hospital Stay (HOSPITAL_COMMUNITY): Payer: Medicare Other

## 2017-05-10 LAB — BLOOD GAS, ARTERIAL
Acid-Base Excess: 14.1 mmol/L — ABNORMAL HIGH (ref 0.0–2.0)
Acid-Base Excess: 8.8 mmol/L — ABNORMAL HIGH (ref 0.0–2.0)
BICARBONATE: 36.3 mmol/L — AB (ref 20.0–28.0)
Bicarbonate: 42.1 mmol/L — ABNORMAL HIGH (ref 20.0–28.0)
DRAWN BY: 521601
Drawn by: 511471
O2 CONTENT: 4 L/min
O2 Content: 3 L/min
O2 SAT: 95.1 %
O2 Saturation: 91.7 %
PATIENT TEMPERATURE: 97.6
Patient temperature: 98.6
pCO2 arterial: 108 mmHg (ref 32.0–48.0)
pCO2 arterial: 87.9 mmHg (ref 32.0–48.0)
pH, Arterial: 7.217 — ABNORMAL LOW (ref 7.350–7.450)
pH, Arterial: 7.236 — ABNORMAL LOW (ref 7.350–7.450)
pO2, Arterial: 70.9 mmHg — ABNORMAL LOW (ref 83.0–108.0)
pO2, Arterial: 79.9 mmHg — ABNORMAL LOW (ref 83.0–108.0)

## 2017-05-10 LAB — COMPREHENSIVE METABOLIC PANEL
ALBUMIN: 2.4 g/dL — AB (ref 3.5–5.0)
ALK PHOS: 82 U/L (ref 38–126)
ALT: 11 U/L — ABNORMAL LOW (ref 14–54)
ANION GAP: 10 (ref 5–15)
AST: 16 U/L (ref 15–41)
BILIRUBIN TOTAL: 0.7 mg/dL (ref 0.3–1.2)
BUN: 9 mg/dL (ref 6–20)
CALCIUM: 8.5 mg/dL — AB (ref 8.9–10.3)
CO2: 35 mmol/L — ABNORMAL HIGH (ref 22–32)
Chloride: 96 mmol/L — ABNORMAL LOW (ref 101–111)
Creatinine, Ser: 0.75 mg/dL (ref 0.44–1.00)
GFR calc Af Amer: >60 mL/min (ref >60)
Glucose, Bld: 98 mg/dL (ref 65–99)
POTASSIUM: 3.7 mmol/L (ref 3.5–5.1)
Sodium: 141 mmol/L (ref 135–145)
TOTAL PROTEIN: 6.5 g/dL (ref 6.5–8.1)

## 2017-05-10 LAB — CBC WITH DIFFERENTIAL/PLATELET
BASOS ABS: 0 10*3/uL (ref 0.0–0.1)
Basophils Relative: 0 %
EOS ABS: 0 10*3/uL (ref 0.0–0.7)
Eosinophils Relative: 1 %
HEMATOCRIT: 28.7 % — AB (ref 36.0–46.0)
HEMOGLOBIN: 8.8 g/dL — AB (ref 12.0–15.0)
LYMPHS PCT: 40 %
Lymphs Abs: 1.5 10*3/uL (ref 0.7–4.0)
MCH: 32.8 pg (ref 26.0–34.0)
MCHC: 30.7 g/dL (ref 30.0–36.0)
MCV: 107.1 fL — ABNORMAL HIGH (ref 78.0–100.0)
MONOS PCT: 10 %
Monocytes Absolute: 0.4 10*3/uL (ref 0.1–1.0)
NEUTROS ABS: 1.8 10*3/uL (ref 1.7–7.7)
NEUTROS PCT: 49 %
Platelets: 152 10*3/uL (ref 150–400)
RBC: 2.68 MIL/uL — AB (ref 3.87–5.11)
RDW: 12.7 % (ref 11.5–15.5)
WBC: 3.7 10*3/uL — AB (ref 4.0–10.5)

## 2017-05-10 LAB — MAGNESIUM: MAGNESIUM: 1.8 mg/dL (ref 1.7–2.4)

## 2017-05-10 MED ORDER — SODIUM CHLORIDE 0.9 % IV BOLUS (SEPSIS)
500.0000 mL | Freq: Once | INTRAVENOUS | Status: AC
  Administered 2017-05-10: 500 mL via INTRAVENOUS

## 2017-05-10 MED ORDER — OLANZAPINE 5 MG PO TABS
5.0000 mg | ORAL_TABLET | Freq: Once | ORAL | Status: AC
  Administered 2017-05-10: 5 mg via ORAL
  Filled 2017-05-10: qty 1

## 2017-05-10 MED ORDER — ACETAMINOPHEN 325 MG PO TABS: 650.0000 mg | ORAL_TABLET | Freq: Four times a day (QID) | ORAL | Status: DC | PRN

## 2017-05-10 MED ORDER — SODIUM CHLORIDE 0.9 % IV BOLUS (SEPSIS)
250.0000 mL | Freq: Once | INTRAVENOUS | Status: AC
  Administered 2017-05-10: 250 mL via INTRAVENOUS

## 2017-05-10 MED ORDER — SODIUM CHLORIDE 0.9 % IV BOLUS (SEPSIS): 500.0000 mL | Freq: Once | INTRAVENOUS | Status: DC

## 2017-05-10 MED ORDER — SODIUM CHLORIDE 0.9 % IV SOLN: INTRAVENOUS | Status: DC

## 2017-05-10 NOTE — Progress Notes (Signed)
[facility] Progress Note  Patient: [name] YQM:[id number]   PCP: Leanna Battles, MD DOB: 06-May-1930   DOA: 05/08/2017   DOS: 05/10/2017   Date of Service: the patient was seen and examined on 05/10/2017  Subjective: Patient overnight received her usual medication and around 4 AM become minimally responsive, hypotensive.  ABG was performed at 7 AM which was showing severe hypercarbia.  Patient was placed on BiPAP with significant improvement in mentation.  Brief hospital course: Pt. with PMH of dystonia with spasticity, monoclonal gammopathy, HTN, depression, anxiety, L3 fracture, chronic respiratory failure; admitted on 05/08/2017, presented with complaint of shortness of breath, was found to have acute on chronic hypoxic respiratory failure due to pleural effusion. Currently further plan is continue further adjustment of medications.  Assessment and Plan: 1.  Acute on chronic hypoxic respiratory failure. Left-sided pleural effusion. Suspected healthcare associated pneumonia. Acute on chronic diastolic CHF. Patient presents with complaints of shortness of breath as well as hypoxia. Uses 2 L of oxygen at baseline.  Saturating 84% on 2 L at my evaluation. Has left-sided pleural effusion. Was recommended to undergo thoracentesis although refusing right now. There was initial some concern about healthcare associated pneumonia although pro-calcitonin negative, no fever no cough.  Less likely pneumonia and therefore will discontinue antibiotics. Possibility of PE cannot be ruled out although no leg swelling no leg pain.  No chest pain as well. Monitor. Most likely this is acute on chronic diastolic CHF and plan was to continue the patient on diuresis although due to hypotension from medication diuresis is currently on hold. Actually receiving IV fluids and monitor.  2.  Severe dystonia. Dysphagia. Patient has severe dystonia and does get Botox injections on a regular basis. Did  not get any injections in last 6 months.  Generally her duration is every 3 months. Appears to have some dysphagia.  Speech therapy consulted. Patient unable to pass bedside swallow evaluation. Discussed with patient and the family.  They are willing to take the risk of aspiration to get patient her regular home medication. Currently her medications are on hold due to encephalopathy.  3.  Acute toxic encephalopathy. Acute on chronic hypercarbic respiratory failure. Patient become minimally responsive on 05/10/2017 in the morning. The night prior to that patient received her regular routine medications. It appears that the patient is unable to tolerate her home regimen. Holding all psychotropic medications for now. ABG was showing severe hypercarbia, patient was placed on BiPAP, repeat ABG shows chronic compensated hypercarbic respiratory failure. Use BiPAP as needed for respiratory distress only.  4.  HTN. Continue blood pressure medication.  5.Anemia of chronic disease. H&H stable.  Monitor.  6.Anxiety. Schizophrenia Initial plan was to continue patient's home regimen now due to metabolic encephalopathy patient's regimen has been on hold. Suspect that the patient's dose should be 5 mg of Zyprexa nightly and 300 mg of gabapentin 3 times daily. Initially family requested some psychiatric consultation although without observing the patient on her current regimen psychiatry consultation will not be beneficial. Monitor.  Diet: NPO except meds   DVT Prophylaxis: subcutaneous Heparin  Advance goals of care discussion: DNR DNI, Had extensive family meeting today. Family was informed regarding palliative care approach, hospice, inpatient hospice versus home hospice. I went over most form with family as well. Patient's POA was not present in the room therefore form was not signed. Palliative care consulted.  Family Communication: family was present at bedside, at the time of interview. The  pt provided permission  to discuss medical plan with the family. Opportunity was given to ask question and all questions were answered satisfactorily.   Disposition:  Discharge to SNF.  Consultants: Palliative care  Procedures: none  Antibiotics: Anti-infectives (From admission, onward)   Start     Dose/Rate Route Frequency Ordered Stop   05/09/17 1400  vancomycin (VANCOCIN) 1,250 mg in sodium chloride 0.9 % 250 mL IVPB  Status:  Discontinued     1,250 mg 166.7 mL/hr over 90 Minutes Intravenous Every 24 hours 05/08/17 1429 05/09/17 1420   05/08/17 2200  ceFEPIme (MAXIPIME) 1 g in sodium chloride 0.9 % 100 mL IVPB  Status:  Discontinued     1 g 200 mL/hr over 30 Minutes Intravenous Every 8 hours 05/08/17 1429 05/09/17 1420   05/08/17 1430  vancomycin (VANCOCIN) 1,500 mg in sodium chloride 0.9 % 500 mL IVPB     1,500 mg 250 mL/hr over 120 Minutes Intravenous  Once 05/08/17 1427 05/08/17 2030   05/08/17 1400  ceFEPIme (MAXIPIME) 2 g in sodium chloride 0.9 % 100 mL IVPB     2 g 200 mL/hr over 30 Minutes Intravenous  Once 05/08/17 1357 05/08/17 1522   05/08/17 1400  vancomycin (VANCOCIN) IVPB 1000 mg/200 mL premix  Status:  Discontinued     1,000 mg 200 mL/hr over 60 Minutes Intravenous  Once 05/08/17 1357 05/08/17 1427       Objective: Physical Exam: Vitals:   05/10/17 0956 05/10/17 1035 05/10/17 1215 05/10/17 1426  BP:  103/60 (!) 94/53   Pulse:  68 70   Resp:      Temp:  98 F (36.7 C) 97.6 F (36.4 C)   TempSrc:      SpO2: 96%  96% 98%  Weight:      Height:        Intake/Output Summary (Last 24 hours) at 05/10/2017 1522 Last data filed at 05/10/2017 0300 Gross per 24 hour  Intake -  Output 300 ml  Net -300 ml   Filed Weights   05/08/17 1400 05/09/17 0129  Weight: 88.9 kg (196 lb) 76.2 kg (167 lb 15.9 oz)   General: Alert, Awake and Oriented to Time, Place and Person. Appear in moderate distress, affect irritable Eyes: PERRL, Conjunctiva normal ENT: Oral Mucosa  clear moist. Neck:  Can not assess JVD, no Abnormal Mass Or lumps Cardiovascular: S1 and S2 Present, no Murmur, Peripheral Pulses Present Respiratory: normal respiratory effort, Bilateral Air entry equal and Decreased, no use of accessory muscle, Clear to Auscultation, no Crackles, no wheezes Abdomen: Bowel Sound pesent, Soft and no tenderness, no hernia Skin: no redness, no Rash, no induration Extremities: no Pedal edema, no calf tenderness Neurologic: Grossly no focal neuro deficit. Bilaterally Equal motor strength  Data Reviewed: CBC: Recent Labs  Lab 05/08/17 1139 05/09/17 0827 05/10/17 0432  WBC 4.5 4.7 3.7*  NEUTROABS 3.1  --  1.8  HGB 9.8* 9.9* 8.8*  HCT 32.2* 32.2* 28.7*  MCV 108.1* 105.6* 107.1*  PLT 180 167 465   Basic Metabolic Panel: Recent Labs  Lab 05/08/17 1139 05/09/17 0827 05/10/17 0432  NA 143 142 141  K 3.9 3.8 3.7  CL 99* 97* 96*  CO2 37* 34* 35*  GLUCOSE 124* 125* 98  BUN 9 6 9   CREATININE 0.49 0.44 0.75  CALCIUM 8.7* 8.6* 8.5*  MG  --   --  1.8    Liver Function Tests: Recent Labs  Lab 05/08/17 1139 05/10/17 0432  AST 17 16  ALT 12*  11*  ALKPHOS 88 82  BILITOT 0.5 0.7  PROT 7.3 6.5  ALBUMIN 2.6* 2.4*   No results for input(s): LIPASE, AMYLASE in the last 168 hours. No results for input(s): AMMONIA in the last 168 hours. Coagulation Profile: No results for input(s): INR, PROTIME in the last 168 hours. Cardiac Enzymes: No results for input(s): CKTOTAL, CKMB, CKMBINDEX, TROPONINI in the last 168 hours. BNP (last 3 results) No results for input(s): PROBNP in the last 8760 hours. CBG: No results for input(s): GLUCAP in the last 168 hours. Studies: Ct Head Wo Contrast  Result Date: 05/10/2017 CLINICAL DATA:  Altered level of consciousness EXAM: CT HEAD WITHOUT CONTRAST TECHNIQUE: Contiguous axial images were obtained from the base of the skull through the vertex without intravenous contrast. COMPARISON:  08/28/2007 FINDINGS: Brain:  Diffuse atrophic changes are again identified. Changes of mild chronic white matter ischemic change are seen. Some encephalomalacia changes are noted in the left frontal lobe similar to that seen on the prior exam. Vascular: No hyperdense vessel or unexpected calcification. Skull: Defect is noted in the left frontal lobe with associated metallic density likely related to prior surgery. This is stable in appearance. Sinuses/Orbits: Mucosal thickening is noted within the ethmoid sinus on the left. The orbits are within normal limits. Other: None IMPRESSION: Chronic changes as described above. No significant interval change is noted. Electronically Signed   By: Inez Catalina M.D.   On: 05/10/2017 10:05   Dg Chest Port 1 View  Result Date: 05/10/2017 CLINICAL DATA:  Shortness of breath. EXAM: PORTABLE CHEST 1 VIEW COMPARISON:  Chest x-rays dated 05/08/2017 and 08/05/2008 and chest CT dated 12/21/2011 FINDINGS: There is persistent opacification of the left lung base. This could represent effusion or lung consolidation. I cannot exclude a mass. There is cardiomegaly. Prominence of the main pulmonary artery. Right lung is clear except for minimal linear atelectasis at the right base medially. Chronic thoracolumbar scoliosis. Arthritic changes of both shoulders. Old healed left rib fractures. Aortic atherosclerosis. IMPRESSION: 1. Persistent opacification of the left lung base which could represent infiltrate, effusion, or mass. 2. Cardiomegaly with enlargement of the main pulmonary artery. Electronically Signed   By: Lorriane Shire M.D.   On: 05/10/2017 09:18    Scheduled Meds: . docusate sodium  100 mg Oral QHS  . enoxaparin (LOVENOX) injection  40 mg Subcutaneous Q24H  . ipratropium-albuterol  3 mL Nebulization TID  . OLANZapine  12.5 mg Oral QHS   Continuous Infusions: . sodium chloride    . sodium chloride     PRN Meds: acetaminophen, bisacodyl, MUSCLE RUB, ondansetron **OR** ondansetron (ZOFRAN) IV,  polyvinyl alcohol  Time spent: 45 minutes  Author: Berle Mull, MD [facility] Pager: [phone] 05/10/2017 3:22 PM  If 7PM-7AM, please contact night-coverage at [email or web address], password [facility]

## 2017-05-10 NOTE — Progress Notes (Signed)
SLP Cancellation Note  Patient Details Name: [name] MRN: [id number] DOB: 1931-01-31   Cancelled treatment:       Reason Eval/Treat Not Completed: Medical issues which prohibited therapy   Juan Quam Laurice 05/10/2017, 1:45 PM

## 2017-05-10 NOTE — Progress Notes (Signed)
Pt BP 72/48 after 250cc NS bolus.  Pt lethargic, arousable to loud verbal stimuli and soft touch but patient does not respond when asked questions.  Stat ABG ordered and additional IVF bolus ordered.  RT called for ABG and to floor to perform.  Will call results to MD per request by Lamar Blinks, NP.

## 2017-05-10 NOTE — Progress Notes (Signed)
Pt BP after 500cc NS bolus 80's/40's.  RR equal, 16 O2 sat 100% on 3L [place]. Pupils pinpoint. Pt still lethargic/ difficult to arouse.  MD at bedside.  RT at bedside to initiate bipap.  Order received for additional IV 500cc NS bolus then IVF at 100cc/hr.  MD placed call to family.

## 2017-05-10 NOTE — Progress Notes (Signed)
Pt transported to CT and back w/o difficulty.  More alert at this time.  Adequate SpO2 on current FIO2.  Continue to monitor closely.

## 2017-05-10 NOTE — Progress Notes (Signed)
CRITICAL VALUE ALERT  Critical Value:  ABG pH 7.21                          PCO2 108                          PO2  70.9                          Bicarb 42.1  Date & Time Notied:  05/10/17 0700  Provider Notified: Dr. Posey Pronto  Orders Received/Actions taken: Stat BIPAP and transfer pt

## 2017-05-10 NOTE — Plan of Care (Signed)
  Education: Knowledge of General Education information will improve 05/10/2017 0005 - Progressing by Bronson Curb, RN   Health Behavior/Discharge Planning: Ability to manage health-related needs will improve 05/10/2017 0005 - Progressing by Bronson Curb, RN   Clinical Measurements: Ability to maintain clinical measurements within normal limits will improve 05/10/2017 0005 - Progressing by Bronson Curb, RN Will remain free from infection 05/10/2017 0005 - Progressing by Bronson Curb, RN Diagnostic test results will improve 05/10/2017 0005 - Progressing by Bronson Curb, RN Respiratory complications will improve 05/10/2017 0005 - Progressing by Bronson Curb, RN Cardiovascular complication will be avoided 05/10/2017 0005 - Progressing by Clarke Peretz, Lanetta Inch, RN

## 2017-05-10 NOTE — Progress Notes (Signed)
Nurse gave another 500cc NS bolus. Patient currently on Bipap RT and Rapid nurse currently at [facility] . Patient will be Transfer to 4NPO5. MD has spoke with the daughter about Pt. Current status.

## 2017-05-10 NOTE — Progress Notes (Signed)
Patient with decreased responsiveness this am per RN.  ABG obtained per orders:  7.2/108/70  Patient placed on Bipap per RT.  Patient also hypotensive BP 74/48-82/53  NS bolus started.  Patient now with SDU orders.   1400  Follow up:  Patient was taken off Bipap about 11am per RN.  ABG about 1pm 7.23/87/79.  Able to arouse patient with stimulation.  Patient able to take a deep breath and cough on command, but still very sleepy.  1800  Follow up:  Patient remains off bipap.  Still able to arouse patient with stimuli.

## 2017-05-10 NOTE — Progress Notes (Signed)
Patient's family concerned regarding 12.5mg  of zyprexa.  Requesting patient to receive 5mg  dose this evening.  K schorr, NP notified.  Order received for one time 5mg  dose this evening

## 2017-05-10 NOTE — Progress Notes (Signed)
BP 84/48 manually.  100% RA, RR 16, T 97.8 orally, pt arousable to verbal stimuli, but falls right back asleep after answering with one word sentences.  Unable to hold a conversation but answering yes/no questions appropriately.  K. Schorr, NP notified.  Awaiting orders.

## 2017-05-11 ENCOUNTER — Inpatient Hospital Stay (HOSPITAL_COMMUNITY): Payer: Medicare Other

## 2017-05-11 DIAGNOSIS — Z79899 Other long term (current) drug therapy: Secondary | ICD-10-CM

## 2017-05-11 DIAGNOSIS — J189 Pneumonia, unspecified organism: Secondary | ICD-10-CM

## 2017-05-11 DIAGNOSIS — F22 Delusional disorders: Secondary | ICD-10-CM

## 2017-05-11 DIAGNOSIS — L8990 Pressure ulcer of unspecified site, unspecified stage: Secondary | ICD-10-CM

## 2017-05-11 LAB — BASIC METABOLIC PANEL
Anion gap: 13 (ref 5–15)
BUN: 14 mg/dL (ref 6–20)
CALCIUM: 8.4 mg/dL — AB (ref 8.9–10.3)
CO2: 31 mmol/L (ref 22–32)
CREATININE: 0.66 mg/dL (ref 0.44–1.00)
Chloride: 99 mmol/L — ABNORMAL LOW (ref 101–111)
GFR calc non Af Amer: >60 mL/min (ref >60)
Glucose, Bld: 93 mg/dL (ref 65–99)
Potassium: 3.8 mmol/L (ref 3.5–5.1)
SODIUM: 143 mmol/L (ref 135–145)

## 2017-05-11 LAB — CBC WITH DIFFERENTIAL/PLATELET
BASOS PCT: 0 %
Basophils Absolute: 0 10*3/uL (ref 0.0–0.1)
EOS ABS: 0 10*3/uL (ref 0.0–0.7)
Eosinophils Relative: 1 %
HEMATOCRIT: 30.5 % — AB (ref 36.0–46.0)
Hemoglobin: 9.1 g/dL — ABNORMAL LOW (ref 12.0–15.0)
Lymphocytes Relative: 25 %
Lymphs Abs: 0.9 10*3/uL (ref 0.7–4.0)
MCH: 32.7 pg (ref 26.0–34.0)
MCHC: 29.8 g/dL — AB (ref 30.0–36.0)
MCV: 109.7 fL — ABNORMAL HIGH (ref 78.0–100.0)
MONO ABS: 0.3 10*3/uL (ref 0.1–1.0)
MONOS PCT: 8 %
Neutro Abs: 2.4 10*3/uL (ref 1.7–7.7)
Neutrophils Relative %: 66 %
Platelets: 147 10*3/uL — ABNORMAL LOW (ref 150–400)
RBC: 2.78 MIL/uL — ABNORMAL LOW (ref 3.87–5.11)
RDW: 12.4 % (ref 11.5–15.5)
WBC: 3.7 10*3/uL — ABNORMAL LOW (ref 4.0–10.5)

## 2017-05-11 LAB — GLUCOSE, CAPILLARY: GLUCOSE-CAPILLARY: 99 mg/dL (ref 65–99)

## 2017-05-11 LAB — MAGNESIUM: Magnesium: 1.7 mg/dL (ref 1.7–2.4)

## 2017-05-11 MED ORDER — GABAPENTIN 100 MG PO CAPS
100.0000 mg | ORAL_CAPSULE | Freq: Two times a day (BID) | ORAL | Status: DC
  Administered 2017-05-11: 100 mg via ORAL
  Filled 2017-05-11: qty 1

## 2017-05-11 MED ORDER — IPRATROPIUM-ALBUTEROL 0.5-2.5 (3) MG/3ML IN SOLN
3.0000 mL | RESPIRATORY_TRACT | Status: DC | PRN
  Administered 2017-05-11 – 2017-05-16 (×3): 3 mL via RESPIRATORY_TRACT
  Filled 2017-05-11 (×3): qty 3

## 2017-05-11 MED ORDER — LORAZEPAM 2 MG/ML IJ SOLN
0.2500 mg | Freq: Once | INTRAMUSCULAR | Status: AC
  Administered 2017-05-11: 0.25 mg via INTRAVENOUS
  Filled 2017-05-11: qty 1

## 2017-05-11 MED ORDER — ALPRAZOLAM 0.25 MG PO TABS
0.2500 mg | ORAL_TABLET | Freq: Three times a day (TID) | ORAL | Status: DC | PRN
  Administered 2017-05-11 – 2017-05-16 (×9): 0.25 mg via ORAL
  Filled 2017-05-11 (×9): qty 1

## 2017-05-11 MED ORDER — FUROSEMIDE 10 MG/ML IJ SOLN: 20.0000 mg | Freq: Two times a day (BID) | INTRAMUSCULAR | Status: DC

## 2017-05-11 MED ORDER — ACETAMINOPHEN 160 MG/5ML PO SOLN
650.0000 mg | Freq: Four times a day (QID) | ORAL | Status: DC | PRN
  Administered 2017-05-11 – 2017-05-16 (×7): 650 mg via ORAL
  Filled 2017-05-11 (×7): qty 20.3

## 2017-05-11 MED ORDER — IPRATROPIUM-ALBUTEROL 0.5-2.5 (3) MG/3ML IN SOLN
RESPIRATORY_TRACT | Status: AC
  Administered 2017-05-13: 3 mL via RESPIRATORY_TRACT
  Filled 2017-05-11: qty 3

## 2017-05-11 MED ORDER — OLANZAPINE 5 MG PO TABS
5.0000 mg | ORAL_TABLET | Freq: Every day | ORAL | Status: DC
  Administered 2017-05-11 – 2017-05-16 (×6): 5 mg via ORAL
  Filled 2017-05-11 (×6): qty 1

## 2017-05-11 MED ORDER — RESOURCE THICKENUP CLEAR PO POWD
ORAL | Status: DC | PRN
Start: 2017-05-11 — End: 2017-05-17
  Filled 2017-05-11: qty 125

## 2017-05-11 MED ORDER — FUROSEMIDE 10 MG/ML IJ SOLN
40.0000 mg | Freq: Once | INTRAMUSCULAR | Status: AC
  Administered 2017-05-11: 40 mg via INTRAVENOUS
  Filled 2017-05-11: qty 4

## 2017-05-11 MED ORDER — STARCH (THICKENING) PO POWD
ORAL | Status: DC | PRN
  Filled 2017-05-11: qty 227

## 2017-05-11 MED ORDER — RESOURCE THICKENUP CLEAR PO POWD
ORAL | Status: DC | PRN
  Filled 2017-05-11: qty 125

## 2017-05-11 MED ORDER — GABAPENTIN 100 MG PO CAPS
100.0000 mg | ORAL_CAPSULE | Freq: Three times a day (TID) | ORAL | Status: DC
Start: 2017-05-11 — End: 2017-05-17
  Administered 2017-05-11 – 2017-05-17 (×18): 100 mg via ORAL
  Filled 2017-05-11 (×18): qty 1
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
## 2017-05-11 NOTE — Progress Notes (Signed)
Palliative Medicine consult noted. Due to high referral volume, this patient will be seen 2/22. Please call the Palliative Medicine Team office at [phone] if recommendations are needed in the interim.  Thank you for inviting [place] to see this patient.  Marjie Skiff Armonie Mettler, RN, BSN, [facility] Palliative Medicine Team 05/11/2017 3:13 PM Office [phone]

## 2017-05-11 NOTE — Progress Notes (Signed)
[facility] Progress Note  Patient: [name] OZH:[id number]   PCP: Leanna Battles, MD DOB: 11/26/1930   DOA: 05/08/2017   DOS: 05/11/2017   Date of Service: the patient was seen and examined on 05/11/2017  Subjective: Overnight patient become hypoxic and hypertensive.  Throughout the day yesterday she was drowsy and lethargic but as the day progressed become more and more awake and was anxious last night.  Did not sleep. Currently complain about having some pain in her buttocks.  No nausea no vomiting no abdominal pain no chest pain.  Brief hospital course: Pt. with PMH of dystonia with spasticity, monoclonal gammopathy, HTN, depression, anxiety, L3 fracture, chronic respiratory failure; admitted on 05/08/2017, presented with complaint of shortness of breath, was found to have acute on chronic hypoxic respiratory failure due to pleural effusion. Currently further plan is continue further adjustment of medications.  Assessment and Plan: 1.  Acute on chronic hypoxic respiratory failure. Left-sided pleural effusion. Suspected healthcare associated pneumonia. Acute on chronic diastolic CHF. Patient presents with complaints of shortness of breath as well as hypoxia. Uses 2 L of oxygen at baseline.  Saturating 84% on 2 L at my evaluation. Has left-sided pleural effusion. Was recommended to undergo thoracentesis although refusing right now. There was initial some concern about healthcare associated pneumonia although pro-calcitonin negative, no fever no cough.  Less likely pneumonia and therefore will discontinue antibiotics. Possibility of PE cannot be ruled out although no leg swelling no leg pain.  No chest pain as well. Most likely this is acute on chronic diastolic CHF and plan was to continue the patient on diuresis although due to hypotension from psychotropic medication diuresis was on hold and patient received IV fluids. Now off of the fluids, can resume diuresis tomorrow  again.  2.  Severe dystonia. Dysphagia. Patient has severe dystonia and does get Botox injections on a regular basis. Did not get any injections in last 6 months.  Generally her duration is every 3 months. Appears to have some dysphagia.  Speech therapy consulted. Appreciate input, patient will be on dysphagia 1 diet. Patient remains at high risk for aspiration.  Family aware.  3.  Acute toxic encephalopathy. Acute on chronic hypercarbic respiratory failure. Patient become minimally responsive on 05/10/2017 in the morning. The night prior to that patient received her regular routine medications. It appears that the patient is unable to tolerate her home regimen. Holding all psychotropic medications for now. ABG was showing severe hypercarbia, patient was placed on BiPAP, repeat ABG shows chronic compensated hypercarbic respiratory failure. Back on her home oxygen.  4.  HTN. Patient is on Inderal, due to hypotension medications are on hold.  Resume diuresis tomorrow.  5.Anemia of chronic disease. H&H stable.  Monitor.  6.Anxiety. Schizophrenia Initial plan was to continue patient's home regimen now due to metabolic encephalopathy patient's regimen has been on hold. Suspect that the patient's dose should be 5 mg of Zyprexa nightly and 300 mg of gabapentin 3 times daily. Appreciate psychiatry input.  Medication adjusted.  Diet: Dysphagia 1 diet  DVT Prophylaxis: subcutaneous Heparin  Advance goals of care discussion: DNR DNI, Had extensive family meeting today. Family was informed regarding palliative care approach, hospice, inpatient hospice versus home hospice. I went over most form with family as well. Patient's POA was not present in the room therefore form was not signed. Palliative care consulted.  Family Communication: family was present at bedside, at the time of interview. The pt provided permission to discuss medical plan  with the family. Opportunity was given to ask  question and all questions were answered satisfactorily.   Disposition:  Discharge to SNF.  Consultants: Palliative care  Procedures: none  Antibiotics: Anti-infectives (From admission, onward)   Start     Dose/Rate Route Frequency Ordered Stop   05/09/17 1400  vancomycin (VANCOCIN) 1,250 mg in sodium chloride 0.9 % 250 mL IVPB  Status:  Discontinued     1,250 mg 166.7 mL/hr over 90 Minutes Intravenous Every 24 hours 05/08/17 1429 05/09/17 1420   05/08/17 2200  ceFEPIme (MAXIPIME) 1 g in sodium chloride 0.9 % 100 mL IVPB  Status:  Discontinued     1 g 200 mL/hr over 30 Minutes Intravenous Every 8 hours 05/08/17 1429 05/09/17 1420   05/08/17 1430  vancomycin (VANCOCIN) 1,500 mg in sodium chloride 0.9 % 500 mL IVPB     1,500 mg 250 mL/hr over 120 Minutes Intravenous  Once 05/08/17 1427 05/08/17 2030   05/08/17 1400  ceFEPIme (MAXIPIME) 2 g in sodium chloride 0.9 % 100 mL IVPB     2 g 200 mL/hr over 30 Minutes Intravenous  Once 05/08/17 1357 05/08/17 1522   05/08/17 1400  vancomycin (VANCOCIN) IVPB 1000 mg/200 mL premix  Status:  Discontinued     1,000 mg 200 mL/hr over 60 Minutes Intravenous  Once 05/08/17 1357 05/08/17 1427       Objective: Physical Exam: Vitals:   05/11/17 1515 05/11/17 1545 05/11/17 1615 05/11/17 1630  BP: 116/88 114/75 (!) 121/59   Pulse:      Resp: (!) 22 (!) 22 (!) 23 (!) 24  Temp:      TempSrc:      SpO2: (!) 88% 94% 95% 96%  Weight:      Height:        Intake/Output Summary (Last 24 hours) at 05/11/2017 1721 Last data filed at 05/11/2017 0549 Gross per 24 hour  Intake 675 ml  Output 650 ml  Net 25 ml   Filed Weights   05/08/17 1400 05/09/17 0129  Weight: 88.9 kg (196 lb) 76.2 kg (167 lb 15.9 oz)   General: Alert, Awake and Oriented to Time, Place and Person. Appear in moderate distress, affect irritable Eyes: PERRL, Conjunctiva normal ENT: Oral Mucosa clear moist. Neck:  Can not assess JVD, no Abnormal Mass Or lumps Cardiovascular: S1 and  S2 Present, no Murmur, Peripheral Pulses Present Respiratory: normal respiratory effort, Bilateral Air entry equal and Decreased, no use of accessory muscle, Clear to Auscultation, no Crackles, no wheezes Abdomen: Bowel Sound pesent, Soft and no tenderness, no hernia Skin: no redness, no Rash, no induration Extremities: no Pedal edema, no calf tenderness Neurologic: Grossly no focal neuro deficit. Bilaterally Equal motor strength  Data Reviewed: CBC: Recent Labs  Lab 05/08/17 1139 05/09/17 0827 05/10/17 0432 05/11/17 0743  WBC 4.5 4.7 3.7* 3.7*  NEUTROABS 3.1  --  1.8 2.4  HGB 9.8* 9.9* 8.8* 9.1*  HCT 32.2* 32.2* 28.7* 30.5*  MCV 108.1* 105.6* 107.1* 109.7*  PLT 180 167 152 782*   Basic Metabolic Panel: Recent Labs  Lab 05/08/17 1139 05/09/17 0827 05/10/17 0432 05/11/17 0743  NA 143 142 141 143  K 3.9 3.8 3.7 3.8  CL 99* 97* 96* 99*  CO2 37* 34* 35* 31  GLUCOSE 124* 125* 98 93  BUN 9 6 9 14   CREATININE 0.49 0.44 0.75 0.66  CALCIUM 8.7* 8.6* 8.5* 8.4*  MG  --   --  1.8 1.7    Liver Function  Tests: Recent Labs  Lab 05/08/17 1139 05/10/17 0432  AST 17 16  ALT 12* 11*  ALKPHOS 88 82  BILITOT 0.5 0.7  PROT 7.3 6.5  ALBUMIN 2.6* 2.4*   No results for input(s): LIPASE, AMYLASE in the last 168 hours. No results for input(s): AMMONIA in the last 168 hours. Coagulation Profile: No results for input(s): INR, PROTIME in the last 168 hours. Cardiac Enzymes: No results for input(s): CKTOTAL, CKMB, CKMBINDEX, TROPONINI in the last 168 hours. BNP (last 3 results) No results for input(s): PROBNP in the last 8760 hours. CBG: No results for input(s): GLUCAP in the last 168 hours. Studies: Dg Chest Port 1 View  Result Date: 05/11/2017 CLINICAL DATA:  Aspiration, cough EXAM: PORTABLE CHEST 1 VIEW COMPARISON:  05/18/2017, 05/08/2017, CT chest 12/21/2011 FINDINGS: No significant interval change in cardiomegaly and vascular congestion. Continued left greater than right  pleural effusion. Consolidation at the lingula and left base. Hazy atelectasis or infiltrate at the right base. Aortic atherosclerosis. No pneumothorax. Old left-sided rib fractures. IMPRESSION: 1. Continued cardiomegaly with vascular congestion. 2. No change in left greater than right bibasilar airspace disease. Left greater than right pleural effusions. Electronically Signed   By: Donavan Foil M.D.   On: 05/11/2017 02:55    Scheduled Meds: . docusate sodium  100 mg Oral QHS  . enoxaparin (LOVENOX) injection  40 mg Subcutaneous Q24H  . gabapentin  100 mg Oral TID  . ipratropium-albuterol  3 mL Nebulization TID  . OLANZapine  5 mg Oral QHS   Continuous Infusions:  PRN Meds: acetaminophen, ALPRAZolam, bisacodyl, ipratropium-albuterol, MUSCLE RUB, ondansetron **OR** ondansetron (ZOFRAN) IV, polyvinyl alcohol, RESOURCE THICKENUP CLEAR, RESOURCE THICKENUP CLEAR  Time spent: 45 minutes  Author: Berle Mull, MD [facility] Pager: [phone] 05/11/2017 5:21 PM  If 7PM-7AM, please contact night-coverage at [email or web address], password [facility]

## 2017-05-11 NOTE — Progress Notes (Signed)
Pt given tylenol elixir PO with sips of water.  Immediately after tylenol given pt began to cough and O2 saturations noted to be 82% with good waveform noted on monitor and HR increased to 130's ST.  Pt denies SOB and RR 22.  Pt states "I feel Ok."  O2 sat increased to 6L [place] without resolution of hypoxia.  Pt placed on 100% NRB with O2 Sat noted to increase to 98% with HR decreasing to 110's.  Lamar Blinks, NP notified.  Orders received for stat pCXR and ativan.  Able to wean O2 to 8L HFNC with oxygen saturation maintaining >92%.  Will continue to titrate oxygen as needed.

## 2017-05-11 NOTE — Progress Notes (Signed)
Pt with 300cc UOP after IV lasix.  Able to wean pt to 5L HFNC O2 with O2 saturations > 92%.  BP 109/68.  Lamar Blinks, NP updated per her request to response to IV lasix.

## 2017-05-11 NOTE — Progress Notes (Signed)
  Speech Language Pathology Treatment: Dysphagia  Patient Details Name: [name] MRN: [id number] DOB: 1931-02-23 Today's Date: 05/11/2017 Time: 0037-0488 SLP Time Calculation (min) (ACUTE ONLY): 15 min  Assessment / Plan / Recommendation Clinical Impression  Pt with improved alertness and participation this morning.  Reassessed swallow function - adequate anticipation and attention to POs; prolonged but functional oral preparation; adequate toleration of nectar thick liquids with no overt s/s of aspiration.  Thin liquids evoked intermittent coughing, suggesting impaired airway protection.  Pt required hand-over-hand assistance for self-feeding.  For today, recommend initiating a dysphagia 1 diet with nectar thick liquids; SLP will follow for safety and diet progression.  Relayed diet information to RN.   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 Approximately 5 days ago patient was diagnosed with pneumoniaand UTIin the facility and placed on Levaquin.At baseline patient isusing supplemental oxygen 2 L/min [place],but due to worsening of shortness of breath was increased to 4 L and still patient was not comfortable. She denied chest pain, chills,but still has productive cough. Recent chest xray revealed moderate cardiomegaly, left basilar opacity is noted concerning for atelectasis or infiltrate with associated pleural effusion. On exam, she has dystonia with atypical muscle movements of her extremities and facial muscles - this is baseline for her as per her son. Exam shows somewhat diminished breath sounds in the LLL but is otherwise unremarkable. Son also  requests BH evaluation d/t mental illness.       SLP Plan  Continue with current plan of care       Recommendations  Diet recommendations: Dysphagia 1 (puree);Nectar-thick liquid Liquids provided via: Cup Medication Administration: Whole meds with puree Supervision: Staff to assist with self feeding Compensations: Minimize environmental distractions;Slow rate;Small sips/bites                Oral Care Recommendations: Oral care BID Follow up Recommendations: Skilled Nursing facility SLP Visit Diagnosis: Dysphagia, unspecified (R13.10) Plan: Continue with current plan of care       GO                [name] 05/11/2017, 2:34 PM

## 2017-05-11 NOTE — Progress Notes (Signed)
Pt now with fine crackles to bilateral lower lobes R>L.  Pt BP 155/112.  K. Schorr, NP notified.  Orders received for IV lasix and to d/c IVF.  IV fluids discontinued and lasix given per orders and to monitor UOP.  Pt remains on 100% NRB at this time with O2 saturations maintaining >94%.  Unable to wean off NRB at this time.

## 2017-05-12 ENCOUNTER — Inpatient Hospital Stay (HOSPITAL_COMMUNITY): Payer: Medicare Other

## 2017-05-12 ENCOUNTER — Institutional Professional Consult (permissible substitution): Payer: Self-pay | Admitting: Pulmonary Disease

## 2017-05-12 DIAGNOSIS — Z7189 Other specified counseling: Secondary | ICD-10-CM

## 2017-05-12 DIAGNOSIS — I509 Heart failure, unspecified: Secondary | ICD-10-CM

## 2017-05-12 DIAGNOSIS — Z515 Encounter for palliative care: Secondary | ICD-10-CM

## 2017-05-12 DIAGNOSIS — J9601 Acute respiratory failure with hypoxia: Secondary | ICD-10-CM

## 2017-05-12 DIAGNOSIS — J90 Pleural effusion, not elsewhere classified: Secondary | ICD-10-CM

## 2017-05-12 DIAGNOSIS — R0902 Hypoxemia: Secondary | ICD-10-CM

## 2017-05-12 LAB — COMPREHENSIVE METABOLIC PANEL
ALK PHOS: 77 U/L (ref 38–126)
ALT: 12 U/L — ABNORMAL LOW (ref 14–54)
ANION GAP: 10 (ref 5–15)
AST: 14 U/L — ABNORMAL LOW (ref 15–41)
Albumin: 2.4 g/dL — ABNORMAL LOW (ref 3.5–5.0)
BILIRUBIN TOTAL: 0.6 mg/dL (ref 0.3–1.2)
BUN: 14 mg/dL (ref 6–20)
CALCIUM: 8.8 mg/dL — AB (ref 8.9–10.3)
CO2: 36 mmol/L — ABNORMAL HIGH (ref 22–32)
Chloride: 98 mmol/L — ABNORMAL LOW (ref 101–111)
Creatinine, Ser: 0.46 mg/dL (ref 0.44–1.00)
GFR calc Af Amer: >60 mL/min (ref >60)
GLUCOSE: 118 mg/dL — AB (ref 65–99)
Potassium: 3.6 mmol/L (ref 3.5–5.1)
Sodium: 144 mmol/L (ref 135–145)
TOTAL PROTEIN: 6.7 g/dL (ref 6.5–8.1)

## 2017-05-12 LAB — CBC WITH DIFFERENTIAL/PLATELET
Basophils Absolute: 0 10*3/uL (ref 0.0–0.1)
Basophils Relative: 0 %
Eosinophils Absolute: 0.1 10*3/uL (ref 0.0–0.7)
Eosinophils Relative: 2 %
HCT: 31.5 % — ABNORMAL LOW (ref 36.0–46.0)
HEMOGLOBIN: 9.3 g/dL — AB (ref 12.0–15.0)
LYMPHS ABS: 0.8 10*3/uL (ref 0.7–4.0)
Lymphocytes Relative: 23 %
MCH: 32 pg (ref 26.0–34.0)
MCHC: 29.5 g/dL — AB (ref 30.0–36.0)
MCV: 108.2 fL — AB (ref 78.0–100.0)
MONO ABS: 0.4 10*3/uL (ref 0.1–1.0)
MONOS PCT: 13 %
NEUTROS PCT: 62 %
Neutro Abs: 2.2 10*3/uL (ref 1.7–7.7)
Platelets: 171 10*3/uL (ref 150–400)
RBC: 2.91 MIL/uL — ABNORMAL LOW (ref 3.87–5.11)
RDW: 12.4 % (ref 11.5–15.5)
WBC: 3.5 10*3/uL — ABNORMAL LOW (ref 4.0–10.5)

## 2017-05-12 LAB — GLUCOSE, CAPILLARY
GLUCOSE-CAPILLARY: 101 mg/dL — AB (ref 65–99)
GLUCOSE-CAPILLARY: 141 mg/dL — AB (ref 65–99)
GLUCOSE-CAPILLARY: 111 mg/dL — AB (ref 65–99)
Glucose-Capillary: 111 mg/dL — ABNORMAL HIGH (ref 65–99)
Glucose-Capillary: 117 mg/dL — ABNORMAL HIGH (ref 65–99)

## 2017-05-12 LAB — MAGNESIUM: MAGNESIUM: 1.8 mg/dL (ref 1.7–2.4)

## 2017-05-12 LAB — BLOOD GAS, ARTERIAL
Acid-Base Excess: 14.3 mmol/L — ABNORMAL HIGH (ref 0.0–2.0)
Bicarbonate: 40 mmol/L — ABNORMAL HIGH (ref 20.0–28.0)
Drawn by: 518061
FIO2: 32
O2 Saturation: 95.9 %
Patient temperature: 98.6
pCO2 arterial: 67.8 mmHg (ref 32.0–48.0)
pH, Arterial: 7.388 (ref 7.350–7.450)
pO2, Arterial: 80 mmHg — ABNORMAL LOW (ref 83.0–108.0)

## 2017-05-12 LAB — D-DIMER, QUANTITATIVE (NOT AT ARMC): D-Dimer, Quant: 1.99 ug/mL-FEU — ABNORMAL HIGH (ref 0.00–0.50)

## 2017-05-12 LAB — BRAIN NATRIURETIC PEPTIDE: B Natriuretic Peptide: 94.7 pg/mL (ref 0.0–100.0)

## 2017-05-12 LAB — PHOSPHORUS: Phosphorus: 2.1 mg/dL — ABNORMAL LOW (ref 2.5–4.6)

## 2017-05-12 MED ORDER — MAGNESIUM SULFATE IN D5W 1-5 GM/100ML-% IV SOLN
1.0000 g | Freq: Once | INTRAVENOUS | Status: AC
  Administered 2017-05-12: 1 g via INTRAVENOUS
  Filled 2017-05-12: qty 100

## 2017-05-12 MED ORDER — FUROSEMIDE 10 MG/ML IJ SOLN
40.0000 mg | Freq: Two times a day (BID) | INTRAMUSCULAR | Status: AC
  Administered 2017-05-12 – 2017-05-13 (×4): 40 mg via INTRAVENOUS
  Filled 2017-05-12 (×5): qty 4

## 2017-05-12 NOTE — Care Management Important Message (Signed)
Important Message  Patient Details  Name: [name] MRN: [id number] Date of Birth: 1930-04-02   Medicare Important Message Given:     Due to illness patient not able to sign/Unsign copy left in the room  [facility] 05/12/2017, 12:06 PM

## 2017-05-12 NOTE — Consult Note (Signed)
Consultation Note Date: 05/12/2017   Patient Name: [name]  DOB: 09/23/1930  MRN: [id number]  Age / Sex: 84 y.o., female  PCP: Ashley Battles, MD Referring Physician: Bonnell Public, MD  Reason for Consultation: Establishing goals of care  HPI/Patient Profile: 84 y.o. female  with past medical history of dystonia of neck with spasticity treated with botox, monoclonal gammopathy, hypertension, depression, L3 compression fracture, chronic home oxygen admitted on 05/08/2017 from SNF with shortness of breath and hypoxia. Found to have acute on chronic respiratory failure secondary to pleural effusion, CHF, and possible HCAP (less likely and antibiotics have been discontinued). On 2/20, patient became minimally responsive. ABG revealed severe hypercarbia and BiPAP placed. Psychotropic medications were on hold. Psych has evaluated and restarted at lower doses. Patient stable on nasal cannula. Palliative medicine consultation for goals of care.   Clinical Assessment and Goals of Care: I have reviewed medical records and discussed with care team. Patient wakes to voice and will answer questions. She is oriented to person and place. Comfortable and resting. No family at bedside. She tells me son, [name] is her HCPOA.   I attempted to call [name] via telephone--wrong home number and he did not answer work number on file. I attempted to call son, [name] who also did not answer. I was able to speak with daughter, [name] via telephone who just left today for home in the [facility].    I introduced Palliative Medicine as specialized medical care for people living with serious illness. It focuses on providing relief from the symptoms and stress of a serious illness. The goal is to improve quality of life for both the patient and the family.  We discussed a brief life review of baseline and events leading up to  hospitalization. Ms. [name] has been at [facility] for at least eight years. Prior to this past fall/winter, she was fairly independent requiring minimal assist with ADL's. She has functionally been declining. She has been on 2L nasal cannula for about a year. [name] speaks of decline for which providers at facility tell the family she is "dying." [name] and her sibling believe she is just "overmedicated" with multiple psych meds on board.  Discussed course of hospitalization and interventions. We discussed clinical improvement now that she is off BiPAP and back on [place]. We did discuss her aspiration risk requiring dysphagia diet.   Advanced directives and concepts specific to code status were discussed. She tells me brother, [name] is documented HCPOA. Copy requested. [name] recalls her and [name]'s conversation with [name] and that the family is interested in completing MOST form. She confirms DNR code status.  [name] speaks of her mother being more lucid than usual in the short period of time that she did NOT receive her psych medications. She is hopeful her mother will continue to show improvement with smaller doses and also have outpatient psych follow-up.   [name] speaks of wanting "the best for mother" and that she is more realistic than [name] and [name] stating  they "think she will live forever."   [name] gives me correct contact information for [name] and [name], who live locally.   Questions and concerns were addressed.    SUMMARY OF RECOMMENDATIONS    DNR  Continue current interventions.   No family at bedside during my visit. Incorrect contact number for son, [name]. I was able to speak with daughter (who lives out of town) and updated numbers.   Daughter speaks of family being interested in completing MOST form.   PMT will follow and attempt f/u with family for further [place] and hospice discussion.   Code Status/Advance Care Planning:  DNR  Symptom Management:   Per  attending  Palliative Prophylaxis:   Aspiration, Delirium Protocol, Oral Care and Turn Reposition  Psycho-social/Spiritual:   Desire for further Chaplaincy support:yes  Additional Recommendations: Caregiving  Support/Resources and Education on Hospice  Prognosis:   Unable to determine  Discharge Planning: To Be Determined      Primary Diagnoses: Present on Admission: . Acute respiratory failure with hypoxia ([place]) . Essential hypertension . Abnormal involuntary movement   I have reviewed the medical record, interviewed the patient and family, and examined the patient. The following aspects are pertinent.  Past Medical History:  Diagnosis Date  . Dystonia   . HTN (hypertension)   . Pneumonia    Social History   Socioeconomic History  . Marital status: Single    Spouse name: None  . Number of children: None  . Years of education: None  . Highest education level: None  Social Needs  . Financial resource strain: None  . Food insecurity - worry: None  . Food insecurity - inability: None  . Transportation needs - medical: None  . Transportation needs - non-medical: None  Occupational History  . None  Tobacco Use  . Smoking status: Never Smoker  . Smokeless tobacco: Never Used  Substance and Sexual Activity  . Alcohol use: No    Frequency: Never  . Drug use: No  . Sexual activity: None  Other Topics Concern  . None  Social History Narrative  . None   Family History  Family history unknown: Yes   Scheduled Meds: . docusate sodium  100 mg Oral QHS  . enoxaparin (LOVENOX) injection  40 mg Subcutaneous Q24H  . furosemide  40 mg Intravenous Q12H  . gabapentin  100 mg Oral TID  . OLANZapine  5 mg Oral QHS   Continuous Infusions: PRN Meds:.acetaminophen, ALPRAZolam, bisacodyl, ipratropium-albuterol, MUSCLE RUB, ondansetron **OR** ondansetron (ZOFRAN) IV, polyvinyl alcohol, RESOURCE THICKENUP CLEAR, RESOURCE THICKENUP CLEAR Medications Prior to Admission:   Prior to Admission medications   Medication Sig Start Date End Date Taking? Authorizing Provider  acetaminophen (TYLENOL) 325 MG tablet Take 650 mg by mouth daily as needed for fever.   Yes [provider]  acetaminophen (TYLENOL) 500 MG tablet Take 1,000 mg by mouth 2 (two) times daily.   Yes [provider]  albuterol (PROVENTIL) (2.5 MG/3ML) 0.083% nebulizer solution Take 2.5 mg by nebulization every 4 (four) hours as needed for wheezing or shortness of breath.   Yes [provider]  ALPRAZolam Duanne Moron) 0.5 MG tablet Take 0.5 mg by mouth 3 (three) times daily.   Yes [provider]  aspirin 81 MG chewable tablet Chew 81 mg by mouth daily.   Yes [provider]  bisacodyl (DULCOLAX) 10 MG suppository Place 10 mg rectally as needed for mild constipation or moderate constipation.   Yes [provider]  Camphor-Menthol-Methyl Sal (TIGER BALM MUSCLE RUB) 05-23-13 % CREA Apply 1 application topically 3 (three) times daily as needed (to lower back three times daily).   Yes [provider]  Cholecalciferol (VITAMIN D3) 50000 units TABS Take 50,000 Units by mouth once a week.   Yes [provider]  Cranberry 450 MG TABS Take 450 mg by mouth 2 (two) times daily.   Yes [provider]  docusate sodium (COLACE) 100 MG capsule Take 100 mg by mouth at bedtime. Hold for loose bowel movement   Yes [provider]  ferrous sulfate 325 (65 FE) MG tablet Take 325 mg by mouth daily with breakfast.   Yes [provider]  furosemide (LASIX) 20 MG tablet Take 20 mg by mouth every Monday, Wednesday, and Friday.   Yes [provider]  gabapentin (NEURONTIN) 100 MG capsule Take 100 mg by mouth 2 (two) times daily. 9am and 12noon   Yes [provider]  gabapentin (NEURONTIN) 600 MG tablet Take 1,200 mg by mouth at bedtime.   Yes [provider]  gabapentin (NEURONTIN) 600 MG tablet Take 600 mg by  mouth 2 (two) times daily. 9am and 12noon   Yes [provider]  Hypromellose (ARTIFICIAL TEARS OP) Place 2 drops into both eyes every 8 (eight) hours as needed (dryness).   Yes [provider]  ipratropium-albuterol (DUONEB) 0.5-2.5 (3) MG/3ML SOLN Take 3 mLs by nebulization every 8 (eight) hours.   Yes [provider]  levofloxacin (LEVAQUIN) 750 MG tablet Take 750 mg by mouth daily.   Yes [provider]  Melatonin 1 MG TABS Take 2 mg by mouth at bedtime.   Yes [provider]  OLANZapine (ZYPREXA) 10 MG tablet Take 10 mg by mouth at bedtime. Give with 2.5mg =12.5mg    Yes [provider]  OLANZapine (ZYPREXA) 2.5 MG tablet Take 2.5 mg by mouth daily. 9am   Yes [provider]  OLANZapine (ZYPREXA) 5 MG tablet Take 2.5 mg by mouth at bedtime.   Yes [provider]  orlistat (ALLI) 60 MG capsule Take 60 mg by mouth every Monday, Wednesday, and Friday.   Yes [provider]  phenylephrine-shark liver oil-mineral oil-petrolatum (PREPARATION H) 0.25-3-14-71.9 % rectal ointment Place 1 application rectally daily as needed for hemorrhoids.   Yes [provider]  polyethylene glycol (MIRALAX / GLYCOLAX) packet Take 17 g by mouth 2 (two) times daily. Hold for loose stool   Yes [provider]  potassium chloride (MICRO-K) 10 MEQ CR capsule Take 10 mEq by mouth every Monday, Wednesday, and Friday.   Yes [provider]  propranolol (INDERAL) 10 MG tablet Take 10 mg by mouth 2 (two) times daily.   Yes [provider]  senna (SENOKOT) 8.6 MG TABS tablet Take 1 tablet by mouth 2 (two) times daily.   Yes [provider]  vitamin B-12 (CYANOCOBALAMIN) 500 MCG tablet Take 1,000 mcg by mouth daily.   Yes [provider]   Not on File Review of Systems  Physical Exam  Constitutional: She is easily aroused. She appears ill.  Pulmonary/Chest: No accessory muscle usage. No  tachypnea. No respiratory distress.  Neurological: She is alert and easily aroused.  Oriented to person and place  Skin: Skin is warm and dry.  Nursing note and vitals reviewed.  Vital Signs: BP (!) 122/53 (BP Location: Left Arm)   Pulse 82   Temp 98.6 F (37 C) (Oral)   Resp 20   Ht 5'  3" (1.6 m)   Wt 79.4 kg (175 lb 0.7 oz)   SpO2 (!) 81%   BMI 31.01 kg/m  Pain Assessment: No/denies pain   Pain Score: 0-No pain   SpO2: SpO2: (!) 81 % O2 Device:SpO2: (!) 81 % O2 Flow Rate: .O2 Flow Rate (L/min): 3 L/min  IO: Intake/output summary:   Intake/Output Summary (Last 24 hours) at 05/12/2017 1627 Last data filed at 05/12/2017 1400 Gross per 24 hour  Intake 240 ml  Output 1400 ml  Net -1160 ml    LBM: Last BM Date: 05/08/17 Baseline Weight: Weight: 88.9 kg (196 lb) Most recent weight: Weight: 79.4 kg (175 lb 0.7 oz)     Palliative Assessment/Data: PPS 40%     Time In: 1545 Time Out: 1645 Time Total: 58min Greater than 50%  of this time was spent counseling and coordinating care related to the above assessment and plan.  Signed by:  Ihor Dow, FNP-C Palliative Medicine Team  Phone: [phone] Fax: [phone]   Please contact Palliative Medicine Team phone at [phone] for questions and concerns.  For individual provider: See Shea Evans

## 2017-05-12 NOTE — Progress Notes (Signed)
  Speech Language Pathology Treatment: Dysphagia  Patient Details Name: [name] MRN: [id number] DOB: [birth year] Today's Date: 05/12/2017 Time: 4709-6283 SLP Time Calculation (min) (ACUTE ONLY): 10 min  Assessment / Plan / Recommendation Clinical Impression  Pt much more alert today, but she is irritable and not amenable for PO intake. Remains afebrile; lung sounds diminished. Encouraged her to drink nectar-thick liquids - she self fed with hand-over-hand assist.  Demonstrated good oral attention, palpable swallow response, excellent toleration of nectar-thick liquids with min cues for positioning/bolus control.  Recommend continuing with conservative diet of dysphagia 1, nectars, until [place] are established.  SLP will follow.     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 Approximately 5 days ago patient was diagnosed with pneumoniaand UTIin the facility and placed on Levaquin.At baseline patient isusing supplemental oxygen 2 L/min [place],but due to worsening of shortness of breath was increased to 4 L and still patient was not comfortable. She denied chest pain, chills,but still has productive cough. Recent chest xray revealed moderate cardiomegaly, left basilar opacity is noted concerning for atelectasis or infiltrate with associated pleural effusion. On exam, she has dystonia with atypical muscle movements of her extremities and facial muscles - this is baseline for her as per her son. Exam shows somewhat diminished breath sounds in the LLL but is otherwise unremarkable. Son also requests BH evaluation d/t mental illness.       SLP Plan  Continue with  current plan of care       Recommendations  Diet recommendations: Dysphagia 1 (puree);Nectar-thick liquid Liquids provided via: Cup Medication Administration: Whole meds with puree Supervision: Staff to assist with self feeding Compensations: Minimize environmental distractions;Slow rate;Small sips/bites                Oral Care Recommendations: Oral care BID Follow up Recommendations: Skilled Nursing facility SLP Visit Diagnosis: Dysphagia, unspecified (R13.10) Plan: Continue with current plan of care       GO                [name] 05/12/2017, 11:36 AM

## 2017-05-12 NOTE — Progress Notes (Signed)
RT instructed pt on use of flutter valve and IS and had pt teach back.

## 2017-05-12 NOTE — Progress Notes (Signed)
[facility] Progress Note  Patient: [name]:[id number]   PCP: Leanna Battles, MD DOB: [birth year]   DOA: 05/08/2017   DOS: 05/12/2017   Date of Service: the patient was seen and examined on 05/12/2017  Subjective: Patient seen.  Available data.  Reviewed.  Patient remains lethargic.  Patient is unable to give any significant history.   Brief hospital course: Pt. with PMH of dystonia with spasticity, monoclonal gammopathy, HTN, depression, anxiety, L3 fracture, chronic respiratory failure; admitted on 05/08/2017, presented with complaint of shortness of breath, was found to have acute on chronic hypoxic respiratory failure due to pleural effusion. Currently further plan is continue further adjustment of medications.  Assessment and Plan: 1.  Acute on chronic combined hypercapnic and hypoxic respiratory failure. Left-sided pleural effusion. Suspected healthcare associated pneumonia. Acute on chronic diastolic CHF. Patient presents with complaints of shortness of breath as well as hypoxia. Uses 2 L of oxygen at baseline.  Saturating 84% on 2 L at my evaluation. Has left-sided pleural effusion. Was recommended to undergo thoracentesis although refusing right now. There was initial some concern about healthcare associated pneumonia although pro-calcitonin negative, no fever no cough.  Less likely pneumonia and therefore will discontinue antibiotics. Possibility of PE cannot be ruled out although no leg swelling no leg pain.  No chest pain as well. Most likely this is acute on chronic diastolic CHF and plan was to continue the patient on diuresis although due to hypotension from psychotropic medication diuresis was on hold and patient received IV fluids. Now off of the fluids, can resume diuresis tomorrow again. -05/12/2017: We will pursue ABG, CT scan of the chest without contrast, echocardiogram.  Will start chest PT, flutter and incentive spirometry.  We will also start gentle  diuresis.  We will continue BiPAP.  Pro-calcitonin is noted (less than 0.1).  2.  Severe dystonia. Dysphagia. Patient has severe dystonia and does get Botox injections on a regular basis. Did not get any injections in last 6 months.  Generally her duration is every 3 months. Appears to have some dysphagia.  Speech therapy consulted. Appreciate input, patient will be on dysphagia 1 diet. Patient remains at high risk for aspiration.  Family aware.  3.  Acute  Metabolic Encephalopathy. Acute on chronic hypercarbic respiratory failure. Patient become minimally responsive on 05/10/2017 in the morning. The night prior to that patient received her regular routine medications. It appears that the patient is unable to tolerate her home regimen. Holding all psychotropic medications for now. ABG was showing severe hypercarbia, patient was placed on BiPAP, repeat ABG shows chronic compensated hypercarbic respiratory failure. Back on her home oxygen. 05/12/2017: Encephalopathy is likely multifactorial.  Patient has elevated CO2, which is likely chronic, with acute worsening.  Patient has psychiatric illness, and has been on mind altering medications.  Monitor closely for mucous plug and aspiration.  4.  HTN. Patient is on Inderal, due to hypotension medications are on hold.  Resume diuresis tomorrow.  5.Anemia of chronic disease. H&H stable.  Monitor.  6.Anxiety. Schizophrenia Initial plan was to continue patient's home regimen now due to metabolic encephalopathy patient's regimen has been on hold. Suspect that the patient's dose should be 5 mg of Zyprexa nightly and 300 mg of gabapentin 3 times daily. Appreciate psychiatry input.  Medication adjusted.  #7  Pleural effusion, worse on the left side: Etiology is unclear. Recent pneumonic process is documented. Will observe for now.  Will also await the effect of June 2 diuresis.  8.  Pulmonary hypertension. This likely secondary to pulmonary  disease. Patient has chronic combined hypercapnic and hypoxemic respiratory failure. Patient is also obese. Patient may have undiagnosed OSA/OHS. Will continue BiPAP.  Patient's prognosis is guarded.  Diet: Dysphagia 1 diet  DVT Prophylaxis: subcutaneous Heparin  Advance goals of care discussion: DNR DNI.  Palliative care input is appreciated. Family Communication: I discussed with the patient's son over the phone, and updated them.  Disposition:  Discharge to SNF.  Consultants: Palliative care  Procedures: none  Antibiotics: Anti-infectives (From admission, onward)   Start     Dose/Rate Route Frequency Ordered Stop   05/09/17 1400  vancomycin (VANCOCIN) 1,250 mg in sodium chloride 0.9 % 250 mL IVPB  Status:  Discontinued     1,250 mg 166.7 mL/hr over 90 Minutes Intravenous Every 24 hours 05/08/17 1429 05/09/17 1420   05/08/17 2200  ceFEPIme (MAXIPIME) 1 g in sodium chloride 0.9 % 100 mL IVPB  Status:  Discontinued     1 g 200 mL/hr over 30 Minutes Intravenous Every 8 hours 05/08/17 1429 05/09/17 1420   05/08/17 1430  vancomycin (VANCOCIN) 1,500 mg in sodium chloride 0.9 % 500 mL IVPB     1,500 mg 250 mL/hr over 120 Minutes Intravenous  Once 05/08/17 1427 05/08/17 2030   05/08/17 1400  ceFEPIme (MAXIPIME) 2 g in sodium chloride 0.9 % 100 mL IVPB     2 g 200 mL/hr over 30 Minutes Intravenous  Once 05/08/17 1357 05/08/17 1522   05/08/17 1400  vancomycin (VANCOCIN) IVPB 1000 mg/200 mL premix  Status:  Discontinued     1,000 mg 200 mL/hr over 60 Minutes Intravenous  Once 05/08/17 1357 05/08/17 1427       Objective: Physical Exam: Vitals:   05/11/17 2355 05/12/17 0349 05/12/17 0613 05/12/17 0800  BP: 102/62 106/87 115/81 (!) 122/53  Pulse: 90 85  90  Resp: (!) 21 (!) 22 (!) 22 20  Temp: 99 F (37.2 C) 98.6 F (37 C)  98.5 F (36.9 C)  TempSrc: Oral Oral  Oral  SpO2: 98% 97% (!) 81%   Weight:  79.4 kg (175 lb 0.7 oz)    Height:        Intake/Output Summary (Last  24 hours) at 05/12/2017 1050 Last data filed at 05/12/2017 0900 Gross per 24 hour  Intake 240 ml  Output 400 ml  Net -160 ml   Filed Weights   05/08/17 1400 05/09/17 0129 05/12/17 0349  Weight: 88.9 kg (196 lb) 76.2 kg (167 lb 15.9 oz) 79.4 kg (175 lb 0.7 oz)   General: Lethargic, but not in any distress.   Eyes: PERRL, Conjunctiva normal ENT: Oral Mucosa clear moist. Neck:  Can not assess JVD Cardiovascular: S1 and S2.  Patient has heart murmur.   Respiratory: Decreased air entry, worse on the left side.   Abdomen: Obese, soft and nontender.  Organs are difficult to assess.  Bowel sounds are present.   Extremities: no Pedal edema, no calf tenderness Neurologic: Lethargic.    Data Reviewed: CBC: Recent Labs  Lab 05/08/17 1139 05/09/17 0827 05/10/17 0432 05/11/17 0743 05/12/17 0701  WBC 4.5 4.7 3.7* 3.7* 3.5*  NEUTROABS 3.1  --  1.8 2.4 2.2  HGB 9.8* 9.9* 8.8* 9.1* 9.3*  HCT 32.2* 32.2* 28.7* 30.5* 31.5*  MCV 108.1* 105.6* 107.1* 109.7* 108.2*  PLT 180 167 152 147* 417   Basic Metabolic Panel: Recent Labs  Lab 05/08/17 1139 05/09/17 0827 05/10/17 0432 05/11/17 0743 05/12/17 0701  NA 143  142 141 143 144  K 3.9 3.8 3.7 3.8 3.6  CL 99* 97* 96* 99* 98*  CO2 37* 34* 35* 31 36*  GLUCOSE 124* 125* 98 93 118*  BUN 9 6 9 14 14   CREATININE 0.49 0.44 0.75 0.66 0.46  CALCIUM 8.7* 8.6* 8.5* 8.4* 8.8*  MG  --   --  1.8 1.7 1.8    Liver Function Tests: Recent Labs  Lab 05/08/17 1139 05/10/17 0432 05/12/17 0701  AST 17 16 14*  ALT 12* 11* 12*  ALKPHOS 88 82 77  BILITOT 0.5 0.7 0.6  PROT 7.3 6.5 6.7  ALBUMIN 2.6* 2.4* 2.4*   No results for input(s): LIPASE, AMYLASE in the last 168 hours. No results for input(s): AMMONIA in the last 168 hours. Coagulation Profile: No results for input(s): INR, PROTIME in the last 168 hours. Cardiac Enzymes: No results for input(s): CKTOTAL, CKMB, CKMBINDEX, TROPONINI in the last 168 hours. BNP (last 3 results) No results for  input(s): PROBNP in the last 8760 hours. CBG: Recent Labs  Lab 05/11/17 2354 05/12/17 0348 05/12/17 0737  GLUCAP 99 101* 111*   Studies: No results found.  Scheduled Meds: . docusate sodium  100 mg Oral QHS  . enoxaparin (LOVENOX) injection  40 mg Subcutaneous Q24H  . furosemide  40 mg Intravenous Q12H  . gabapentin  100 mg Oral TID  . OLANZapine  5 mg Oral QHS   Continuous Infusions: . magnesium sulfate 1 - 4 g bolus IVPB     PRN Meds: acetaminophen, ALPRAZolam, bisacodyl, ipratropium-albuterol, MUSCLE RUB, ondansetron **OR** ondansetron (ZOFRAN) IV, polyvinyl alcohol, RESOURCE THICKENUP CLEAR, RESOURCE THICKENUP CLEAR  Time spent: 45 minutes  Author: Dana Allan, MD [facility] Pager: [phone].  05/12/2017 10:50 AM  If 7PM-7AM, please contact night-coverage at [email or web address], password [facility]

## 2017-05-12 NOTE — Progress Notes (Signed)
CPT held at this time, patient family wants her to relax. They didn't want her to be irritable. Pt is stable at this time.

## 2017-05-13 ENCOUNTER — Other Ambulatory Visit (HOSPITAL_COMMUNITY): Payer: Medicare Other

## 2017-05-13 DIAGNOSIS — R0602 Shortness of breath: Secondary | ICD-10-CM

## 2017-05-13 LAB — CBC WITH DIFFERENTIAL/PLATELET
Basophils Absolute: 0 10*3/uL (ref 0.0–0.1)
Basophils Relative: 0 %
Eosinophils Absolute: 0.1 10*3/uL (ref 0.0–0.7)
Eosinophils Relative: 1 %
HCT: 33.3 % — ABNORMAL LOW (ref 36.0–46.0)
Hemoglobin: 10.1 g/dL — ABNORMAL LOW (ref 12.0–15.0)
Lymphocytes Relative: 24 %
Lymphs Abs: 1.2 10*3/uL (ref 0.7–4.0)
MCH: 32.1 pg (ref 26.0–34.0)
MCHC: 30.3 g/dL (ref 30.0–36.0)
MCV: 105.7 fL — ABNORMAL HIGH (ref 78.0–100.0)
Monocytes Absolute: 0.4 10*3/uL (ref 0.1–1.0)
Monocytes Relative: 8 %
Neutro Abs: 3.3 10*3/uL (ref 1.7–7.7)
Neutrophils Relative %: 67 %
Platelets: 200 10*3/uL (ref 150–400)
RBC: 3.15 MIL/uL — ABNORMAL LOW (ref 3.87–5.11)
RDW: 12.2 % (ref 11.5–15.5)
WBC: 4.9 10*3/uL (ref 4.0–10.5)

## 2017-05-13 LAB — RENAL FUNCTION PANEL
Albumin: 2.6 g/dL — ABNORMAL LOW (ref 3.5–5.0)
Anion gap: 13 (ref 5–15)
BUN: 10 mg/dL (ref 6–20)
CO2: 39 mmol/L — ABNORMAL HIGH (ref 22–32)
Calcium: 8.7 mg/dL — ABNORMAL LOW (ref 8.9–10.3)
Chloride: 89 mmol/L — ABNORMAL LOW (ref 101–111)
Creatinine, Ser: 0.41 mg/dL — ABNORMAL LOW (ref 0.44–1.00)
GFR calc Af Amer: >60 mL/min (ref >60)
GFR calc non Af Amer: >60 mL/min (ref >60)
Glucose, Bld: 117 mg/dL — ABNORMAL HIGH (ref 65–99)
Phosphorus: 1.9 mg/dL — ABNORMAL LOW (ref 2.5–4.6)
Potassium: 3.2 mmol/L — ABNORMAL LOW (ref 3.5–5.1)
Sodium: 141 mmol/L (ref 135–145)

## 2017-05-13 LAB — GLUCOSE, CAPILLARY
GLUCOSE-CAPILLARY: 105 mg/dL — AB (ref 65–99)
Glucose-Capillary: 116 mg/dL — ABNORMAL HIGH (ref 65–99)
Glucose-Capillary: 99 mg/dL (ref 65–99)

## 2017-05-13 LAB — CULTURE, BLOOD (ROUTINE X 2)
CULTURE: NO GROWTH
Special Requests: ADEQUATE

## 2017-05-13 MED ORDER — POTASSIUM CHLORIDE CRYS ER 20 MEQ PO TBCR: 40.0000 meq | EXTENDED_RELEASE_TABLET | ORAL | Status: DC

## 2017-05-13 MED ORDER — POTASSIUM CHLORIDE 20 MEQ PO PACK
40.0000 meq | PACK | ORAL | Status: AC
  Administered 2017-05-13 (×2): 40 meq via ORAL
  Filled 2017-05-13 (×3): qty 2

## 2017-05-13 MED ORDER — METOPROLOL TARTRATE 5 MG/5ML IV SOLN
5.0000 mg | INTRAVENOUS | Status: DC | PRN
  Filled 2017-05-13: qty 5

## 2017-05-13 NOTE — Clinical Social Work Note (Signed)
Pt is from [facility]. Palliative working with family to determine [place]. CSW continuing to follow for disposition.   [place], [place]

## 2017-05-13 NOTE — Progress Notes (Signed)
[facility] Progress Note  Patient: [name] XBD:[id number]   PCP: Leanna Battles, MD DOB: Aug 27, 1930   DOA: 05/08/2017   DOS: 05/13/2017   Date of Service: the patient was seen and examined on 05/13/2017  Subjective: Patient looks better today.  Patient is more in interactive.  No new complaints.  No fever or chills.  No shortness of breath.  Brief hospital course: Pt. with PMH of dystonia with spasticity, monoclonal gammopathy, HTN, depression, anxiety, L3 fracture, chronic respiratory failure; admitted on 05/08/2017, presented with complaint of shortness of breath, was found to have acute on chronic hypoxic respiratory failure due to pleural effusion. Currently further plan is continue further adjustment of medications.  Assessment and Plan: 1.  Acute on chronic combined hypercapnic and hypoxic respiratory failure. Left-sided pleural effusion. Suspected healthcare associated pneumonia. Acute on chronic diastolic CHF. Patient presents with complaints of shortness of breath as well as hypoxia. Uses 2 L of oxygen at baseline.  Saturating 84% on 2 L at my evaluation. Has left-sided pleural effusion. Was recommended to undergo thoracentesis although refusing right now. There was initial some concern about healthcare associated pneumonia although pro-calcitonin negative, no fever no cough.  Less likely pneumonia and therefore will discontinue antibiotics. Possibility of PE cannot be ruled out although no leg swelling no leg pain.  No chest pain as well. Most likely this is acute on chronic diastolic CHF and plan was to continue the patient on diuresis although due to hypotension from psychotropic medication diuresis was on hold and patient received IV fluids. Now off of the fluids, can resume diuresis tomorrow again. -05/12/2017: We will pursue ABG, CT scan of the chest without contrast, echocardiogram.  Will start chest PT, flutter and incentive spirometry.  We will also start gentle  diuresis.  We will continue BiPAP.  Pro-calcitonin is noted (less than 0.1). 05/13/2017: We will continue current management.  Patient seems to be improving.  Continue diuretics for now.  2.  Severe dystonia. Dysphagia. Patient has severe dystonia and does get Botox injections on a regular basis. Did not get any injections in last 6 months.  Generally her duration is every 3 months. Appears to have some dysphagia.  Speech therapy consulted. Appreciate input, patient will be on dysphagia 1 diet. Patient remains at high risk for aspiration.  Family aware.  3.  Acute  Metabolic Encephalopathy. Acute on chronic hypercarbic respiratory failure. Patient become minimally responsive on 05/10/2017 in the morning. The night prior to that patient received her regular routine medications. It appears that the patient is unable to tolerate her home regimen. Holding all psychotropic medications for now. ABG was showing severe hypercarbia, patient was placed on BiPAP, repeat ABG shows chronic compensated hypercarbic respiratory failure. Back on her home oxygen. 05/12/2017: Encephalopathy is likely multifactorial.  Patient has elevated CO2, which is likely chronic, with acute worsening.  Patient has psychiatric illness, and has been on mind altering medications.  Monitor closely for mucous plug and aspiration. 05/13/2017: Encephalopathy seems to be improving as well.  Continue current management.  4.  HTN. Patient is on Inderal, due to hypotension medications are on hold. 05/13/2017: The blood pressure is controlled.  Continue current regimen.  5.Anemia of chronic disease. H&H stable.  Monitor.  6.Anxiety. Schizophrenia Initial plan was to continue patient's home regimen now due to metabolic encephalopathy patient's regimen has been on hold. Suspect that the patient's dose should be 5 mg of Zyprexa nightly and 300 mg of gabapentin 3 times daily. Appreciate psychiatry  input.  Medication adjusted.  #7   Pleural effusion, worse on the left side: Etiology is unclear. Recent pneumonic process is documented. Will observe for now.  Will also await the effect of diuresis.  8.  Pulmonary hypertension. This likely secondary to pulmonary disease. Patient has chronic combined hypercapnic and hypoxemic respiratory failure. Patient is also obese. Patient may have undiagnosed OSA/OHS. Will continue BiPAP.  9.  Hypokalemia: Will replete. K-Dur 40 M EQ orally every 4 hours x 2 doses. Continue to monitor renal function and electrolytes.  Diet: Dysphagia 1 diet  DVT Prophylaxis: subcutaneous Heparin  Advance goals of care discussion: DNR DNI.  Palliative care input is appreciated. Family Communication: I discussed with the patient's son over the phone, and updated them.  Disposition:  Discharge to SNF.  Consultants: Palliative care  Procedures: none  Antibiotics: Anti-infectives (From admission, onward)   Start     Dose/Rate Route Frequency Ordered Stop   05/09/17 1400  vancomycin (VANCOCIN) 1,250 mg in sodium chloride 0.9 % 250 mL IVPB  Status:  Discontinued     1,250 mg 166.7 mL/hr over 90 Minutes Intravenous Every 24 hours 05/08/17 1429 05/09/17 1420   05/08/17 2200  ceFEPIme (MAXIPIME) 1 g in sodium chloride 0.9 % 100 mL IVPB  Status:  Discontinued     1 g 200 mL/hr over 30 Minutes Intravenous Every 8 hours 05/08/17 1429 05/09/17 1420   05/08/17 1430  vancomycin (VANCOCIN) 1,500 mg in sodium chloride 0.9 % 500 mL IVPB     1,500 mg 250 mL/hr over 120 Minutes Intravenous  Once 05/08/17 1427 05/08/17 2030   05/08/17 1400  ceFEPIme (MAXIPIME) 2 g in sodium chloride 0.9 % 100 mL IVPB     2 g 200 mL/hr over 30 Minutes Intravenous  Once 05/08/17 1357 05/08/17 1522   05/08/17 1400  vancomycin (VANCOCIN) IVPB 1000 mg/200 mL premix  Status:  Discontinued     1,000 mg 200 mL/hr over 60 Minutes Intravenous  Once 05/08/17 1357 05/08/17 1427       Objective: Physical Exam: Vitals:    05/13/17 0212 05/13/17 0412 05/13/17 0600 05/13/17 0816  BP: (!) 134/92 (!) 116/102  132/84  Pulse:    90  Resp: (!) 21 (!) 21    Temp:    98.8 F (37.1 C)  TempSrc:    Oral  SpO2: 98% 97% 96% 97%  Weight:      Height:        Intake/Output Summary (Last 24 hours) at 05/13/2017 1215 Last data filed at 05/13/2017 1028 Gross per 24 hour  Intake 60 ml  Output 1625 ml  Net -1565 ml   Filed Weights   05/08/17 1400 05/09/17 0129 05/12/17 0349  Weight: 88.9 kg (196 lb) 76.2 kg (167 lb 15.9 oz) 79.4 kg (175 lb 0.7 oz)   General: Awake and alert.  Not in any distress. Eyes: PERRL, Conjunctiva normal ENT: Oral Mucosa clear moist. Neck:  Can not assess JVD Cardiovascular: S1 and S2.   Respiratory: Decreased air entry, worse on the left side.   Abdomen: Obese, soft and nontender.  Organs are difficult to assess.  Bowel sounds are present.   Extremities: Leg edema. Neurologic: Patient is awake and alert.  Data Reviewed: CBC: Recent Labs  Lab 05/08/17 1139 05/09/17 0827 05/10/17 0432 05/11/17 0743 05/12/17 0701 05/13/17 0205  WBC 4.5 4.7 3.7* 3.7* 3.5* 4.9  NEUTROABS 3.1  --  1.8 2.4 2.2 3.3  HGB 9.8* 9.9* 8.8* 9.1* 9.3* 10.1*  HCT 32.2* 32.2* 28.7* 30.5* 31.5* 33.3*  MCV 108.1* 105.6* 107.1* 109.7* 108.2* 105.7*  PLT 180 167 152 147* 171 008   Basic Metabolic Panel: Recent Labs  Lab 05/09/17 0827 05/10/17 0432 05/11/17 0743 05/12/17 0701 05/12/17 1048 05/13/17 0205  NA 142 141 143 144  --  141  K 3.8 3.7 3.8 3.6  --  3.2*  CL 97* 96* 99* 98*  --  89*  CO2 34* 35* 31 36*  --  39*  GLUCOSE 125* 98 93 118*  --  117*  BUN 6 9 14 14   --  10  CREATININE 0.44 0.75 0.66 0.46  --  0.41*  CALCIUM 8.6* 8.5* 8.4* 8.8*  --  8.7*  MG  --  1.8 1.7 1.8  --   --   PHOS  --   --   --   --  2.1* 1.9*    Liver Function Tests: Recent Labs  Lab 05/08/17 1139 05/10/17 0432 05/12/17 0701 05/13/17 0205  AST 17 16 14*  --   ALT 12* 11* 12*  --   ALKPHOS 88 82 77  --   BILITOT  0.5 0.7 0.6  --   PROT 7.3 6.5 6.7  --   ALBUMIN 2.6* 2.4* 2.4* 2.6*   No results for input(s): LIPASE, AMYLASE in the last 168 hours. No results for input(s): AMMONIA in the last 168 hours. Coagulation Profile: No results for input(s): INR, PROTIME in the last 168 hours. Cardiac Enzymes: No results for input(s): CKTOTAL, CKMB, CKMBINDEX, TROPONINI in the last 168 hours. BNP (last 3 results) No results for input(s): PROBNP in the last 8760 hours. CBG: Recent Labs  Lab 05/12/17 1108 05/12/17 1608 05/12/17 1946 05/13/17 0007 05/13/17 0359  GLUCAP 141* 111* 117* 116* 99   Studies: Ct Chest Wo Contrast  Result Date: 05/12/2017 CLINICAL DATA:  Chest pain shortness of breath EXAM: CT CHEST WITHOUT CONTRAST TECHNIQUE: Multidetector CT imaging of the chest was performed following the standard protocol without IV contrast. COMPARISON:  Chest x-ray 05/11/2017, CT chest 12/21/2011 FINDINGS: Cardiovascular: Limited evaluation without intravenous contrast. Mild aortic atherosclerotic calcification. Ectatic ascending aorta measuring up to 3.9 cm. Dilated pulmonary trunk, measuring 4.5 cm. Coronary vessel calcification. Mild cardiomegaly. Small moderate pericardial effusion, measuring up to 17 mm in thickness on the right side. Mediastinum/Nodes: Midline trachea. No thyroid mass. Slightly enlarged mediastinal lymph nodes. Right paratracheal lymph node measures 11 mm, right low paratracheal lymph node measures 9 mm. Esophagus within normal limits Lungs/Pleura: Small left greater than right pleural effusion. Bilateral lower lobe and lingular partial consolidations with air bronchograms. No pneumothorax. Upper Abdomen: No acute abnormality. Musculoskeletal: Scoliosis of the spine.  Vertebral hemangioma at L1 IMPRESSION: 1. Small right greater than left pleural effusions. Partial consolidations in the bilateral lower lobes and lingula, suspicious for pneumonia. 2. Small moderate pericardial effusion, measuring  up to 17 mm in maximum thickness on the right side 3. Dilated pulmonary trunk, suggestive of pulmonary artery hypertension 4. Small mediastinal lymph nodes, likely reactive Aortic Atherosclerosis (ICD10-I70.0). Electronically Signed   By: Donavan Foil M.D.   On: 05/12/2017 19:29    Scheduled Meds: . docusate sodium  100 mg Oral QHS  . enoxaparin (LOVENOX) injection  40 mg Subcutaneous Q24H  . furosemide  40 mg Intravenous Q12H  . gabapentin  100 mg Oral TID  . OLANZapine  5 mg Oral QHS  . potassium chloride  40 mEq Oral Q4H   Continuous Infusions:  PRN Meds: acetaminophen,  ALPRAZolam, bisacodyl, ipratropium-albuterol, MUSCLE RUB, ondansetron **OR** ondansetron (ZOFRAN) IV, polyvinyl alcohol, RESOURCE THICKENUP CLEAR, RESOURCE THICKENUP CLEAR  Time spent: 45 minutes  Author: Dana Allan, MD [facility] Pager: [phone].  05/13/2017 12:15 PM  If 7PM-7AM, please contact night-coverage at [email or web address], password [facility]

## 2017-05-13 NOTE — Progress Notes (Signed)
  PRN neb given for faint expiratory wheeze, no SOB, increase WOB, or respiratory compromise noted

## 2017-05-13 NOTE — Progress Notes (Signed)
Pt noted to be pulling at oxygen tubing, refusing to wear oxygen with frequent desaturations due to noncompliance with medical equipment.  Pt also noted to be pulling at telemetry monitor and IV.  Pt c/o anxiety.  PRN xanax given for anxiety.

## 2017-05-14 ENCOUNTER — Inpatient Hospital Stay (HOSPITAL_COMMUNITY): Payer: Medicare Other

## 2017-05-14 LAB — RENAL FUNCTION PANEL
Albumin: 2.4 g/dL — ABNORMAL LOW (ref 3.5–5.0)
Anion gap: 11 (ref 5–15)
BUN: 10 mg/dL (ref 6–20)
CO2: 39 mmol/L — ABNORMAL HIGH (ref 22–32)
Calcium: 8.5 mg/dL — ABNORMAL LOW (ref 8.9–10.3)
Chloride: 90 mmol/L — ABNORMAL LOW (ref 101–111)
Creatinine, Ser: 0.44 mg/dL (ref 0.44–1.00)
GFR calc Af Amer: >60 mL/min (ref >60)
GFR calc non Af Amer: >60 mL/min (ref >60)
Glucose, Bld: 106 mg/dL — ABNORMAL HIGH (ref 65–99)
Phosphorus: 2.6 mg/dL (ref 2.5–4.6)
Potassium: 3.7 mmol/L (ref 3.5–5.1)
Sodium: 140 mmol/L (ref 135–145)

## 2017-05-14 LAB — GLUCOSE, CAPILLARY
GLUCOSE-CAPILLARY: 134 mg/dL — AB (ref 65–99)
GLUCOSE-CAPILLARY: 97 mg/dL (ref 65–99)

## 2017-05-14 MED ORDER — DIGOXIN 0.25 MG/ML IJ SOLN
0.2500 mg | Freq: Once | INTRAMUSCULAR | Status: AC
  Administered 2017-05-14: 0.25 mg via INTRAVENOUS
  Filled 2017-05-14: qty 1

## 2017-05-14 MED ORDER — SODIUM CHLORIDE 0.9 % IV SOLN
INTRAVENOUS | Status: AC
  Administered 2017-05-14: 09:00:00 via INTRAVENOUS

## 2017-05-14 MED ORDER — SODIUM CHLORIDE 0.9 % IV SOLN
INTRAVENOUS | Status: AC
  Administered 2017-05-15: 01:00:00 via INTRAVENOUS

## 2017-05-14 NOTE — Significant Event (Addendum)
Rapid Response Event Note  Overview: Follow Up (RT asked me assess patient)  Initial Focused Assessment: Upon arrival, patient was asleep, awakens quickly, follow commands and moves all extremities, sats > 95% on 3L, not in acute respiratory distress, RR 20-22,  HR elevated in the upper 120-130s ST, RN to administer digoxin. Earlier patient received 40mg  Lasix IV. Lung sounds clear and good air movement. BP now 94/56 (70), earlier pressure was soft but has improved. Patient denies CP and SOB, but does complain of pain in her legs, heat packs applied.   Interventions: -- No RRT Interventions  Plan of Care (if not transferred): -- Call RRT if needed  Event Summary:   at    [place] Time 0100 End Time [place], [place]

## 2017-05-14 NOTE — Significant Event (Signed)
Rapid Response Event Note  Overview: Time Called: 1210 Arrival Time: 1215 Event Type: Cardiac  Initial Focused Assessment: RN floor call for Pt complaints of acute chest pain. Upon arrival, patient awake follow commands and moves all extremities, sats 94-97% on 3L, not in acute respiratory distress, RR 20-22,  HR elevated in the 130s  ST,Lung sounds clear and good air movement. BP earlier pressure was soft but has improved to 126/101 . Patient denies CP and SOB, but does complain of being "uncomfortable".  Dr. Marthenia Rolling paged prior to my arrival no return call yet, RN advised to re page to update MD on Pt status and to review EKG.     Interventions: EKG completed, no acute changes. Lungs auscultated and clear.   Plan of Care (if not transferred): RN advised to re page MD and call RRT if needed  Event Summary: Name of Physician Notified: Dr. Marthenia Rolling  at 1212    at    Outcome: Stayed in room and stabalized  Event End Time: [place], [place]

## 2017-05-14 NOTE — Progress Notes (Addendum)
HR in 130s, BP  91/56, RR 21. Lopressor ordered for HR over 120's, no BP parameters. MD notified.  MD gave verbal orders to give NS @ 50 mL/hr over 10 hours. RN will continue to monitor.  Rapid response called and assessed patient. See RR note.

## 2017-05-14 NOTE — Progress Notes (Signed)
RRT/ and Rapid response Nurse  followed up with pt noted pt will not need BiPAP, Pt resting quietly . Oxygen 3 l/min via [place].No acute distress at this time, warm compress applied to bil.LE for comfort. RN will continue to monitor pt.

## 2017-05-14 NOTE — Progress Notes (Addendum)
[facility] Progress Note  Patient: [name] HYQ:[id number]   PCP: Leanna Battles, MD DOB: 1930-12-15   DOA: 05/08/2017   DOS: 05/14/2017   Date of Service: the patient was seen and examined on 05/14/2017  Subjective: Patient seen alongside patient's nurse.  Patient is tachycardic.  Low blood pressure was noted earlier today.  Patient has been tachycardic.  IV Lasix has been discontinued, and the patient was started on cautious hydration. Patient is more in interactive.  No new complaints.  No fever or chills.  No shortness of breath.  Brief hospital course: Pt. with PMH of dystonia with spasticity, monoclonal gammopathy, HTN, depression, anxiety, L3 fracture, chronic respiratory failure; admitted on 05/08/2017, presented with complaint of shortness of breath, was found to have acute on chronic hypoxic respiratory failure due to pleural effusion. Currently further plan is continue further adjustment of medications.  Assessment and Plan: 1.  Acute on chronic combined hypercapnic and hypoxic respiratory failure. Left-sided pleural effusion. Suspected healthcare associated pneumonia. Acute on chronic diastolic CHF. Patient presents with complaints of shortness of breath as well as hypoxia. Uses 2 L of oxygen at baseline.  Saturating 84% on 2 L at my evaluation. Has left-sided pleural effusion. Was recommended to undergo thoracentesis although refusing right now. There was initial some concern about healthcare associated pneumonia although pro-calcitonin negative, no fever no cough.  Less likely pneumonia and therefore will discontinue antibiotics. Possibility of PE cannot be ruled out although no leg swelling no leg pain.  No chest pain as well. Most likely this is acute on chronic diastolic CHF and plan was to continue the patient on diuresis although due to hypotension from psychotropic medication diuresis was on hold and patient received IV fluids. Now off of the fluids, can resume  diuresis tomorrow again. -05/12/2017: We will pursue ABG, CT scan of the chest without contrast, echocardiogram.  Will start chest PT, flutter and incentive spirometry.  We will also start gentle diuresis.  We will continue BiPAP.  Pro-calcitonin is noted (less than 0.1). 05/13/2017: We will continue current management.  Patient seems to be improving.  Continue diuretics for now. 05/14/2017: Main problem today seems to be low normal blood pressure and tachycardia.  Diuretics are nonalcoholic.  Cautious hydration has been started.  We will continue to monitor closely.  Will repeat a chest x-ray.  2.  Severe dystonia. Dysphagia. Patient has severe dystonia and does get Botox injections on a regular basis. Did not get any injections in last 6 months.  Generally her duration is every 3 months. Appears to have some dysphagia.  Speech therapy consulted. Appreciate input, patient will be on dysphagia 1 diet. Patient remains at high risk for aspiration.  Family aware.  3.  Acute  Metabolic Encephalopathy. Acute on chronic hypercarbic respiratory failure. Patient become minimally responsive on 05/10/2017 in the morning. The night prior to that patient received her regular routine medications. It appears that the patient is unable to tolerate her home regimen. Holding all psychotropic medications for now. ABG was showing severe hypercarbia, patient was placed on BiPAP, repeat ABG shows chronic compensated hypercarbic respiratory failure. Back on her home oxygen. 05/12/2017: Encephalopathy is likely multifactorial.  Patient has elevated CO2, which is likely chronic, with acute worsening.  Patient has psychiatric illness, and has been on mind altering medications.  Monitor closely for mucous plug and aspiration. 05/13/2017: Encephalopathy seems to be improving as well.  Continue current management. 05/14/2017: This continues to improve.  4.  HTN. Patient is  on Inderal, due to hypotension medications are on  hold. 05/13/2017: The blood pressure is controlled.  Continue current regimen. 05/14/2017: Monitor closely.  The blood pressure has been low normal.  5.Anemia of chronic disease. H&H stable.  Monitor.  6.Anxiety. Schizophrenia Initial plan was to continue patient's home regimen now due to metabolic encephalopathy patient's regimen has been on hold. Suspect that the patient's dose should be 5 mg of Zyprexa nightly and 300 mg of gabapentin 3 times daily. Appreciate psychiatry input.  Medication adjusted.  #7  Pleural effusion, worse on the left side: Etiology is unclear. Recent pneumonic process is documented. Will observe for now.  Will also await the effect of diuresis.  8.  Pulmonary hypertension. This likely secondary to pulmonary disease. Patient has chronic combined hypercapnic and hypoxemic respiratory failure. Patient is also obese. Patient may have undiagnosed OSA/OHS. Will continue BiPAP.  9.  Hypokalemia: Will replete. K-Dur 40 M EQ orally every 4 hours x 2 doses. Continue to monitor renal function and electrolytes.  Diet: Dysphagia 1 diet  DVT Prophylaxis: subcutaneous Heparin  Advance goals of care discussion: DNR DNI.  Palliative care input is appreciated. Family Communication: I discussed with the patient's son over the phone, and updated them.  Disposition:  Discharge to SNF.  Consultants: Palliative care  Procedures: none  Antibiotics: Anti-infectives (From admission, onward)   Start     Dose/Rate Route Frequency Ordered Stop   05/09/17 1400  vancomycin (VANCOCIN) 1,250 mg in sodium chloride 0.9 % 250 mL IVPB  Status:  Discontinued     1,250 mg 166.7 mL/hr over 90 Minutes Intravenous Every 24 hours 05/08/17 1429 05/09/17 1420   05/08/17 2200  ceFEPIme (MAXIPIME) 1 g in sodium chloride 0.9 % 100 mL IVPB  Status:  Discontinued     1 g 200 mL/hr over 30 Minutes Intravenous Every 8 hours 05/08/17 1429 05/09/17 1420   05/08/17 1430  vancomycin (VANCOCIN)  1,500 mg in sodium chloride 0.9 % 500 mL IVPB     1,500 mg 250 mL/hr over 120 Minutes Intravenous  Once 05/08/17 1427 05/08/17 2030   05/08/17 1400  ceFEPIme (MAXIPIME) 2 g in sodium chloride 0.9 % 100 mL IVPB     2 g 200 mL/hr over 30 Minutes Intravenous  Once 05/08/17 1357 05/08/17 1522   05/08/17 1400  vancomycin (VANCOCIN) IVPB 1000 mg/200 mL premix  Status:  Discontinued     1,000 mg 200 mL/hr over 60 Minutes Intravenous  Once 05/08/17 1357 05/08/17 1427       Objective: Physical Exam: Vitals:   05/14/17 0748 05/14/17 1111 05/14/17 1230 05/14/17 1513  BP: (!) 91/56 94/66 (!) 126/101   Pulse: (!) 120 (!) 123  (!) (P) 125  Resp:   (!) 33   Temp: 97.8 F (36.6 C) 97.6 F (36.4 C)  (P) 98.3 F (36.8 C)  TempSrc: Axillary Axillary  (P) Axillary  SpO2:   96%   Weight:      Height:        Intake/Output Summary (Last 24 hours) at 05/14/2017 1650 Last data filed at 05/14/2017 0500 Gross per 24 hour  Intake 132 ml  Output 1400 ml  Net -1268 ml   Filed Weights   05/09/17 0129 05/12/17 0349 05/14/17 0033  Weight: 76.2 kg (167 lb 15.9 oz) 79.4 kg (175 lb 0.7 oz) 76.4 kg (168 lb 6.9 oz)   General: Awake and alert.  Not in any distress. Eyes: PERRL, Conjunctiva normal ENT: Oral Mucosa clear moist. Neck:  Can not assess JVD Cardiovascular: S1 and S2. ESM.   Respiratory: Decreased air entry, worse on the left side.   Abdomen: Obese, soft and nontender.  Organs are difficult to assess.  Bowel sounds are present.   Extremities: No Leg edema. Neurologic: Patient is awake and alert.  Data Reviewed: CBC: Recent Labs  Lab 05/08/17 1139 05/09/17 0827 05/10/17 0432 05/11/17 0743 05/12/17 0701 05/13/17 0205  WBC 4.5 4.7 3.7* 3.7* 3.5* 4.9  NEUTROABS 3.1  --  1.8 2.4 2.2 3.3  HGB 9.8* 9.9* 8.8* 9.1* 9.3* 10.1*  HCT 32.2* 32.2* 28.7* 30.5* 31.5* 33.3*  MCV 108.1* 105.6* 107.1* 109.7* 108.2* 105.7*  PLT 180 167 152 147* 171 242   Basic Metabolic Panel: Recent Labs  Lab  05/10/17 0432 05/11/17 0743 05/12/17 0701 05/12/17 1048 05/13/17 0205 05/14/17 0453  NA 141 143 144  --  141 140  K 3.7 3.8 3.6  --  3.2* 3.7  CL 96* 99* 98*  --  89* 90*  CO2 35* 31 36*  --  39* 39*  GLUCOSE 98 93 118*  --  117* 106*  BUN 9 14 14   --  10 10  CREATININE 0.75 0.66 0.46  --  0.41* 0.44  CALCIUM 8.5* 8.4* 8.8*  --  8.7* 8.5*  MG 1.8 1.7 1.8  --   --   --   PHOS  --   --   --  2.1* 1.9* 2.6    Liver Function Tests: Recent Labs  Lab 05/08/17 1139 05/10/17 0432 05/12/17 0701 05/13/17 0205 05/14/17 0453  AST 17 16 14*  --   --   ALT 12* 11* 12*  --   --   ALKPHOS 88 82 77  --   --   BILITOT 0.5 0.7 0.6  --   --   PROT 7.3 6.5 6.7  --   --   ALBUMIN 2.6* 2.4* 2.4* 2.6* 2.4*   No results for input(s): LIPASE, AMYLASE in the last 168 hours. No results for input(s): AMMONIA in the last 168 hours. Coagulation Profile: No results for input(s): INR, PROTIME in the last 168 hours. Cardiac Enzymes: No results for input(s): CKTOTAL, CKMB, CKMBINDEX, TROPONINI in the last 168 hours. BNP (last 3 results) No results for input(s): PROBNP in the last 8760 hours. CBG: Recent Labs  Lab 05/13/17 0007 05/13/17 0359 05/13/17 1938 05/14/17 0014 05/14/17 0349  GLUCAP 116* 99 105* 134* 97   Studies: No results found.  Scheduled Meds: . docusate sodium  100 mg Oral QHS  . enoxaparin (LOVENOX) injection  40 mg Subcutaneous Q24H  . gabapentin  100 mg Oral TID  . OLANZapine  5 mg Oral QHS   Continuous Infusions: . sodium chloride 50 mL/hr at 05/14/17 0900   PRN Meds: acetaminophen, ALPRAZolam, bisacodyl, ipratropium-albuterol, metoprolol tartrate, MUSCLE RUB, ondansetron **OR** ondansetron (ZOFRAN) IV, polyvinyl alcohol, RESOURCE THICKENUP CLEAR, RESOURCE THICKENUP CLEAR  Time spent: 35 minutes  Author: Dana Allan, MD [facility] Pager: [phone].  05/14/2017 4:50 PM  If 7PM-7AM, please contact night-coverage at [email or web address], password [facility]

## 2017-05-14 NOTE — Progress Notes (Signed)
Pt has been tachycardic,  sustained in 130's to 145 bpm, and restless. Was medicated for pain, anxiety and furosimide 40 mg   and reposition for comfort, but  pt remained tachy, MD on call notified, new order for lopressor & not given r/t  BP being low ,  81/49  at 2356 . MD notified and digozin 0.25mg  given and hr came down to mid 120's. RN will continue to monitor.

## 2017-05-14 NOTE — CV Procedure (Signed)
Attempted 2D Echo, heart rate too high, notified nurse.  [name]

## 2017-05-14 NOTE — Progress Notes (Signed)
Patient tachycardic in upper 130's to 140s. MD notified, verbal order given to continue fluids for 24 hours. Will continue to monitor. MD requested to not give lopressor.   RN attempted to give patient dulcolax suppository. Patient refused. Given 8oz hot prune juice (nectar thick) instead

## 2017-05-15 ENCOUNTER — Inpatient Hospital Stay (HOSPITAL_COMMUNITY): Payer: Medicare Other

## 2017-05-15 LAB — CBC WITH DIFFERENTIAL/PLATELET
Basophils Absolute: 0 10*3/uL (ref 0.0–0.1)
Basophils Relative: 0 %
EOS ABS: 0.1 10*3/uL (ref 0.0–0.7)
EOS PCT: 2 %
HCT: 30 % — ABNORMAL LOW (ref 36.0–46.0)
Hemoglobin: 8.9 g/dL — ABNORMAL LOW (ref 12.0–15.0)
LYMPHS ABS: 1.5 10*3/uL (ref 0.7–4.0)
LYMPHS PCT: 31 %
MCH: 31.9 pg (ref 26.0–34.0)
MCHC: 29.7 g/dL — AB (ref 30.0–36.0)
MCV: 107.5 fL — AB (ref 78.0–100.0)
MONO ABS: 0.4 10*3/uL (ref 0.1–1.0)
MONOS PCT: 9 %
Neutro Abs: 2.8 10*3/uL (ref 1.7–7.7)
Neutrophils Relative %: 58 %
PLATELETS: 225 10*3/uL (ref 150–400)
RBC: 2.79 MIL/uL — ABNORMAL LOW (ref 3.87–5.11)
RDW: 12.5 % (ref 11.5–15.5)
WBC: 4.8 10*3/uL (ref 4.0–10.5)

## 2017-05-15 LAB — MRSA PCR SCREENING: MRSA by PCR: NEGATIVE

## 2017-05-15 LAB — PROCALCITONIN: Procalcitonin: <0.1 ng/mL

## 2017-05-15 MED ORDER — VANCOMYCIN HCL IN DEXTROSE 750-5 MG/150ML-% IV SOLN
750.0000 mg | Freq: Two times a day (BID) | INTRAVENOUS | Status: DC
  Administered 2017-05-15: 750 mg via INTRAVENOUS
  Filled 2017-05-15 (×2): qty 150

## 2017-05-15 MED ORDER — DEXTROSE 5 % IV SOLN
500.0000 mg | Freq: Two times a day (BID) | INTRAVENOUS | Status: DC
  Administered 2017-05-15 (×2): 500 mg via INTRAVENOUS
  Filled 2017-05-15 (×3): qty 0.5

## 2017-05-15 MED ORDER — SODIUM CHLORIDE 0.9 % IV BOLUS (SEPSIS)
250.0000 mL | Freq: Once | INTRAVENOUS | Status: AC
  Administered 2017-05-15: 250 mL via INTRAVENOUS

## 2017-05-15 MED ORDER — VANCOMYCIN HCL 10 G IV SOLR
1500.0000 mg | Freq: Once | INTRAVENOUS | Status: AC
  Administered 2017-05-15: 1500 mg via INTRAVENOUS
  Filled 2017-05-15: qty 1500

## 2017-05-15 NOTE — Progress Notes (Signed)
[facility] Progress Note  Patient: [name] QIH:[id number]   PCP: Leanna Battles, MD DOB: [birth year]   DOA: 05/08/2017   DOS: 05/15/2017   Date of Service: the patient was seen and examined on 05/15/2017  Subjective: Patient seen alongside patient's nurse.  Also discussed with the patient's son and Education officer, museum.  Discharge planning is in progress.  The patient will need CPAP at nighttime, considering elevated CO2 in ABG.  Will check MRSA PCR.  Will check pro-calcitonin.  Likely discharge in the next 24-48 hours.  Tachycardia has resolved.  Blood pressure is low normal, will continue to monitor.   Brief hospital course: Pt. with PMH of dystonia with spasticity, monoclonal gammopathy, HTN, depression, anxiety, L3 fracture, chronic respiratory failure; admitted on 05/08/2017, presented with complaint of shortness of breath, was found to have acute on chronic hypoxic respiratory failure due to pleural effusion. Currently further plan is continue further adjustment of medications.  Assessment and Plan: 1.  Acute on chronic combined hypercapnic and hypoxic respiratory failure. Left-sided pleural effusion. Suspected healthcare associated pneumonia. Acute on chronic diastolic CHF. Patient presents with complaints of shortness of breath as well as hypoxia. Uses 2 L of oxygen at baseline.  Saturating 84% on 2 L at my evaluation. Has left-sided pleural effusion. Was recommended to undergo thoracentesis although refusing right now. There was initial some concern about healthcare associated pneumonia although pro-calcitonin negative, no fever no cough.  Less likely pneumonia and therefore will discontinue antibiotics. Possibility of PE cannot be ruled out although no leg swelling no leg pain.  No chest pain as well. Most likely this is acute on chronic diastolic CHF and plan was to continue the patient on diuresis although due to hypotension from psychotropic medication diuresis was on hold  and patient received IV fluids. Now off of the fluids, can resume diuresis tomorrow again. -05/12/2017: We will pursue ABG, CT scan of the chest without contrast, echocardiogram.  Will start chest PT, flutter and incentive spirometry.  We will also start gentle diuresis.  We will continue BiPAP.  Pro-calcitonin is noted (less than 0.1). 05/13/2017: We will continue current management.  Patient seems to be improving.  Continue diuretics for now. 05/14/2017: Main problem today seems to be low normal blood pressure and tachycardia.  Diuretics are nonalcoholic.  Cautious hydration has been started.  We will continue to monitor closely.  Will repeat a chest x-ray. 05/15/2017: The patient has been optimized.  Continue CPAP at nighttime on discharge.  Continue incentive spirometry and flutter valve.  Follow results of MRSA PCR.  If this comes back negative, we will discontinue vancomycin.  Consider discharging patient on Augmentin if MRSA PCR is negative.  2.  Severe dystonia. Dysphagia. Patient has severe dystonia and does get Botox injections on a regular basis. Did not get any injections in last 6 months.  Generally her duration is every 3 months. Appears to have some dysphagia.  Speech therapy consulted. Appreciate input, patient will be on dysphagia 1 diet. Patient remains at high risk for aspiration.  Family aware. 05/15/2017: Currently see it recommendation by speech therapy regarding aspiration precautions.  3.  Acute  Metabolic Encephalopathy. Acute on chronic hypercarbic respiratory failure. Patient become minimally responsive on 05/10/2017 in the morning. The night prior to that patient received her regular routine medications. It appears that the patient is unable to tolerate her home regimen. Holding all psychotropic medications for now. ABG was showing severe hypercarbia, patient was placed on BiPAP, repeat ABG shows chronic  compensated hypercarbic respiratory failure. Back on her home  oxygen. 05/12/2017: Encephalopathy is likely multifactorial.  Patient has elevated CO2, which is likely chronic, with acute worsening.  Patient has psychiatric illness, and has been on mind altering medications.  Monitor closely for mucous plug and aspiration. 05/13/2017: Encephalopathy seems to be improving as well.  Continue current management. 05/14/2017: This continues to improve. 05/15/2017: This has improved significantly.  4.  HTN. Patient is on Inderal, due to hypotension medications are on hold. 05/13/2017: The blood pressure is controlled.  Continue current regimen. 05/14/2017: Monitor closely.  The blood pressure has been low normal.  5.Anemia of chronic disease. H&H stable.  Monitor.  6.Anxiety. Schizophrenia Initial plan was to continue patient's home regimen now due to metabolic encephalopathy patient's regimen has been on hold. Suspect that the patient's dose should be 5 mg of Zyprexa nightly and 300 mg of gabapentin 3 times daily. Appreciate psychiatry input.  Medication adjusted.  #7  Pleural effusion, worse on the left side: Etiology is unclear. Recent pneumonic process is documented. Will observe for now.  Will also await the effect of diuresis.  8.  Pulmonary hypertension. This likely secondary to pulmonary disease. Patient has chronic combined hypercapnic and hypoxemic respiratory failure. Patient is also obese. Patient may have undiagnosed OSA/OHS. Will continue BiPAP.  9.  Hypokalemia: Will replete. K-Dur 40 M EQ orally every 4 hours x 2 doses. Continue to monitor renal function and electrolytes.  Diet: Dysphagia 1 diet  DVT Prophylaxis: subcutaneous Heparin  Advance goals of care discussion: DNR DNI.  Palliative care input is appreciated. Family Communication: I discussed with the patient's son over the phone, and updated them.  Disposition:  Discharge to SNF. Patient will need CPAP at nighttime on discharge. If possible, palliative care team to follow  patient on discharge.  Consultants: Palliative care  Procedures: none  Antibiotics: Anti-infectives (From admission, onward)   Start     Dose/Rate Route Frequency Ordered Stop   05/15/17 1000  ceFEPIme (MAXIPIME) 500 mg in dextrose 5 % 50 mL IVPB    Comments:  Pharmacy to dose   500 mg 100 mL/hr over 30 Minutes Intravenous Every 12 hours 05/15/17 0815     05/09/17 1400  vancomycin (VANCOCIN) 1,250 mg in sodium chloride 0.9 % 250 mL IVPB  Status:  Discontinued     1,250 mg 166.7 mL/hr over 90 Minutes Intravenous Every 24 hours 05/08/17 1429 05/09/17 1420   05/08/17 2200  ceFEPIme (MAXIPIME) 1 g in sodium chloride 0.9 % 100 mL IVPB  Status:  Discontinued     1 g 200 mL/hr over 30 Minutes Intravenous Every 8 hours 05/08/17 1429 05/09/17 1420   05/08/17 1430  vancomycin (VANCOCIN) 1,500 mg in sodium chloride 0.9 % 500 mL IVPB     1,500 mg 250 mL/hr over 120 Minutes Intravenous  Once 05/08/17 1427 05/08/17 2030   05/08/17 1400  ceFEPIme (MAXIPIME) 2 g in sodium chloride 0.9 % 100 mL IVPB     2 g 200 mL/hr over 30 Minutes Intravenous  Once 05/08/17 1357 05/08/17 1522   05/08/17 1400  vancomycin (VANCOCIN) IVPB 1000 mg/200 mL premix  Status:  Discontinued     1,000 mg 200 mL/hr over 60 Minutes Intravenous  Once 05/08/17 1357 05/08/17 1427       Objective: Physical Exam: Vitals:   05/14/17 2012 05/15/17 0041 05/15/17 0427 05/15/17 0753  BP: 105/87 99/71 (!) 88/62   Pulse: (!) 140 (!) 102  98  Resp: (!) 27 Marland Kitchen)  25    Temp: 100.1 F (37.8 C) 99.3 F (37.4 C) 98.6 F (37 C) 99.1 F (37.3 C)  TempSrc: Oral Axillary Axillary Oral  SpO2: 99% 95% 98%   Weight:      Height:        Intake/Output Summary (Last 24 hours) at 05/15/2017 0816 Last data filed at 05/15/2017 0811 Gross per 24 hour  Intake 240 ml  Output -  Net 240 ml   Filed Weights   05/09/17 0129 05/12/17 0349 05/14/17 0033  Weight: 76.2 kg (167 lb 15.9 oz) 79.4 kg (175 lb 0.7 oz) 76.4 kg (168 lb 6.9 oz)   General:  Awake and alert.  Not in any distress. Eyes: PERRL, Conjunctiva normal ENT: Oral Mucosa clear moist. Neck:  Can not assess JVD Cardiovascular: S1 and S2. ESM.   Respiratory: Decreased air entry, worse on the left side.   Abdomen: Obese, soft and nontender.  Organs are difficult to assess.  Bowel sounds are present.   Extremities: No Leg edema. Neurologic: Patient is awake and alert.  Data Reviewed: CBC: Recent Labs  Lab 05/10/17 0432 05/11/17 0743 05/12/17 0701 05/13/17 0205 05/15/17 0306  WBC 3.7* 3.7* 3.5* 4.9 4.8  NEUTROABS 1.8 2.4 2.2 3.3 2.8  HGB 8.8* 9.1* 9.3* 10.1* 8.9*  HCT 28.7* 30.5* 31.5* 33.3* 30.0*  MCV 107.1* 109.7* 108.2* 105.7* 107.5*  PLT 152 147* 171 200 161   Basic Metabolic Panel: Recent Labs  Lab 05/10/17 0432 05/11/17 0743 05/12/17 0701 05/12/17 1048 05/13/17 0205 05/14/17 0453  NA 141 143 144  --  141 140  K 3.7 3.8 3.6  --  3.2* 3.7  CL 96* 99* 98*  --  89* 90*  CO2 35* 31 36*  --  39* 39*  GLUCOSE 98 93 118*  --  117* 106*  BUN 9 14 14   --  10 10  CREATININE 0.75 0.66 0.46  --  0.41* 0.44  CALCIUM 8.5* 8.4* 8.8*  --  8.7* 8.5*  MG 1.8 1.7 1.8  --   --   --   PHOS  --   --   --  2.1* 1.9* 2.6    Liver Function Tests: Recent Labs  Lab 05/08/17 1139 05/10/17 0432 05/12/17 0701 05/13/17 0205 05/14/17 0453  AST 17 16 14*  --   --   ALT 12* 11* 12*  --   --   ALKPHOS 88 82 77  --   --   BILITOT 0.5 0.7 0.6  --   --   PROT 7.3 6.5 6.7  --   --   ALBUMIN 2.6* 2.4* 2.4* 2.6* 2.4*   No results for input(s): LIPASE, AMYLASE in the last 168 hours. No results for input(s): AMMONIA in the last 168 hours. Coagulation Profile: No results for input(s): INR, PROTIME in the last 168 hours. Cardiac Enzymes: No results for input(s): CKTOTAL, CKMB, CKMBINDEX, TROPONINI in the last 168 hours. BNP (last 3 results) No results for input(s): PROBNP in the last 8760 hours. CBG: Recent Labs  Lab 05/13/17 0007 05/13/17 0359 05/13/17 1938  05/14/17 0014 05/14/17 0349  GLUCAP 116* 99 105* 134* 97   Studies: Dg Chest Port 1 View  Result Date: 05/14/2017 CLINICAL DATA:  Shortness of breath. EXAM: PORTABLE CHEST 1 VIEW COMPARISON:  One-view chest x-ray 05/11/2017 FINDINGS: Heart is enlarged. Atherosclerotic calcifications are again noted at the aortic arch. Aeration is improved since the prior study. The right lung base is clear. There is no edema  or definite effusion. Degenerative changes are noted at the shoulders. IMPRESSION: 1. Stable cardiomegaly. 2. Improved aeration. Electronically Signed   By: San Morelle M.D.   On: 05/14/2017 19:07    Scheduled Meds: . ceFEPIme (MAXIPIME) IVPB  500 mg Intravenous Q12H  . docusate sodium  100 mg Oral QHS  . enoxaparin (LOVENOX) injection  40 mg Subcutaneous Q24H  . gabapentin  100 mg Oral TID  . OLANZapine  5 mg Oral QHS   Continuous Infusions: . sodium chloride 50 mL/hr at 05/15/17 0047   PRN Meds: acetaminophen, ALPRAZolam, bisacodyl, ipratropium-albuterol, metoprolol tartrate, MUSCLE RUB, ondansetron **OR** ondansetron (ZOFRAN) IV, polyvinyl alcohol, RESOURCE THICKENUP CLEAR, RESOURCE THICKENUP CLEAR  Time spent: 35 minutes  Author: Dana Allan, MD [facility] Pager: [phone].  05/15/2017 8:16 AM  If 7PM-7AM, please contact night-coverage at [email or web address], password [facility]

## 2017-05-15 NOTE — NC FL2 (Signed)
[place] LEVEL OF CARE SCREENING TOOL     IDENTIFICATION  Patient Name: [name] Birthdate: 1930/06/11 Sex: female Admission Date (Current Location): 05/08/2017  [facility] and [place] Number:  Herbalist and Address:  [facility] [place], [place] [address] [place], [place] [address]      Provider Number: [id number]  Attending Physician Name and Address:  Bonnell Public, MD  Relative Name and Phone Number:       Current Level of Care: Hospital Recommended Level of Care: [place] Prior Approval Number:    Date Approved/Denied:   PASRR Number:    Discharge Plan: SNF    Current Diagnoses: Patient Active Problem List   Diagnosis Date Noted  . Hypoxia   . Congestive heart failure ([place])   . Palliative care by specialist   . Goals of care, counseling/discussion   . Pressure injury of skin 05/11/2017  . Paranoia ([place])   . Acute respiratory failure with hypoxia ([place]) 05/08/2017  . HCAP (healthcare-associated pneumonia) 05/08/2017  . Pleural effusion 05/08/2017  . Dystonia 05/08/2017  . FLATULENCE-GAS-BLOATING 03/04/2010  . PERSONAL HX COLONIC POLYPS 03/04/2010  . BACK PAIN 07/10/2008  . MONOCLONAL GAMMOPATHY 11/03/2006  . HEMATURIA 11/03/2006  . Abnormal involuntary movement 11/03/2006  . FX CLOSED VERTEBRA NOS 11/03/2006  . Essential hypertension 09/21/2006    Orientation RESPIRATION BLADDER Height & Weight     Self, Time, Situation, Place  O2, Other (Comment)([place] 3.5L; CPAP at night) Incontinent Weight: 168 lb 6.9 oz (76.4 kg) Height:  5\' 3"  (160 cm)  BEHAVIORAL SYMPTOMS/MOOD NEUROLOGICAL BOWEL NUTRITION STATUS      Incontinent Diet(puree, nectar thick liquids)  AMBULATORY STATUS COMMUNICATION OF NEEDS Skin   Extensive Assist Verbally PU Stage and Appropriate Care PU Stage 1 Dressing: (foam dressing, changed PRN)                     Personal Care Assistance Level of Assistance  Bathing,  Feeding, Dressing Bathing Assistance: Maximum assistance Feeding assistance: Maximum assistance Dressing Assistance: Maximum assistance     Functional Limitations Info  Sight, Hearing, Speech Sight Info: Adequate Hearing Info: Adequate Speech Info: Adequate    SPECIAL CARE FACTORS FREQUENCY                       Contractures Contractures Info: Not present    Additional Factors Info  Code Status, Allergies, Psychotropic Code Status Info: DNR Allergies Info: NKA Psychotropic Info: Zyprexa 5mg  daily at bedtime         Current Medications (05/15/2017):  This is the current hospital active medication list Current Facility-Administered Medications  Medication Dose Route Frequency Provider Last Rate Last Dose  . acetaminophen (TYLENOL) solution 650 mg  650 mg Oral Q6H PRN Lavina Hamman, MD   650 mg at 05/14/17 2245  . ALPRAZolam Duanne Moron) tablet 0.25 mg  0.25 mg Oral TID PRN Lavina Hamman, MD   0.25 mg at 05/14/17 2029  . bisacodyl (DULCOLAX) suppository 10 mg  10 mg Rectal PRN [place] Grice S, PA-C      . ceFEPIme (MAXIPIME) 500 mg in dextrose 5 % 50 mL IVPB  500 mg Intravenous Q12H Dana Allan I, MD   500 mg at 05/15/17 1132  . docusate sodium (COLACE) capsule 100 mg  100 mg Oral QHS Brenton Grills, PA-C   100 mg at 05/14/17 2147  . enoxaparin (LOVENOX) injection 40 mg  40  mg Subcutaneous Q24H York Grice S, PA-C   40 mg at 05/15/17 1132  . gabapentin (NEURONTIN) capsule 100 mg  100 mg Oral TID Lavina Hamman, MD   100 mg at 05/15/17 1132  . ipratropium-albuterol (DUONEB) 0.5-2.5 (3) MG/3ML nebulizer solution 3 mL  3 mL Nebulization Q4H PRN Leanna Battles, MD   3 mL at 05/13/17 0600  . metoprolol tartrate (LOPRESSOR) injection 5 mg  5 mg Intravenous Q5 min PRN Blount, Scarlette Shorts T, NP      . MUSCLE RUB CREA 1 application  1 application Topical TID PRN Brenton Grills, PA-C   1 application at 15/61/53 2301  . OLANZapine (ZYPREXA) tablet 5 mg  5 mg Oral  QHS Lavina Hamman, MD   5 mg at 05/14/17 2147  . ondansetron (ZOFRAN) tablet 4 mg  4 mg Oral Q6H PRN [place] Grice S, PA-C       Or  . ondansetron (ZOFRAN) injection 4 mg  4 mg Intravenous Q6H PRN Brenton Grills, PA-C   4 mg at 05/09/17 1652  . polyvinyl alcohol (LIQUIFILM TEARS) 1.4 % ophthalmic solution 2 drop  2 drop Both Eyes Q8H PRN Brenton Grills, PA-C      . RESOURCE THICKENUP CLEAR   Oral PRN Lavina Hamman, MD      . [place]   Oral PRN Lavina Hamman, MD      . vancomycin (VANCOCIN) IVPB 750 mg/150 ml premix  750 mg Intravenous Q12H Hammons, Theone Murdoch, [facility]         Discharge Medications: Please see discharge summary for a list of discharge medications.  Relevant Imaging Results:  Relevant Lab Results:   Additional Information SS#: [id number]  Geralynn Ochs, LCSW

## 2017-05-15 NOTE — Procedures (Signed)
Echo attempted.  Patient refuses test even though it was explained to patient that echo test was ultrasound without needles (non-invasive).

## 2017-05-15 NOTE — Progress Notes (Signed)
  Speech Language Pathology Treatment: Dysphagia  Patient Details Name: [name] MRN: [id number] DOB: 1930-08-03 Today's Date: 05/15/2017 Time: 3212-2482 SLP Time Calculation (min) (ACUTE ONLY): 15 min  Assessment / Plan / Recommendation Clinical Impression  Skilled treatment session focused on dysphagia goals. SLP facilitated session by providing skilled observation of pt consuming applesauce. Advanced textures were offered and encouraged by SLP - however pt refused. Pt with minimal lingual bolus manipulation of puree leading to increased oral phase but appearance of timely swallow initiation. No overt coughing or wetness noted, however at the end of 3 oz pt's O2 fluctuated more than with beginning of consumption. Recommend offering pt rest breaks during PO consumption. Continue per current diet and plan of care.    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 Approximately 5 days ago patient was diagnosed with pneumoniaand UTIin the facility and placed on Levaquin.At baseline patient isusing supplemental oxygen 2 L/min [place],but due to worsening of shortness of breath was increased to 4 L and still patient was not comfortable. She denied chest pain, chills,but still has productive cough. Recent chest xray revealed moderate cardiomegaly, left basilar opacity is noted concerning for atelectasis or infiltrate with associated pleural effusion. On exam, she has dystonia with atypical muscle movements of her extremities and facial muscles - this is baseline for her as per her son. Exam shows somewhat diminished breath sounds in the LLL but is otherwise unremarkable.  Son also requests BH evaluation d/t mental illness.       SLP Plan  Continue with current plan of care       Recommendations  Diet recommendations: Dysphagia 1 (puree);Nectar-thick liquid Liquids provided via: Cup Medication Administration: Whole meds with puree Supervision: Staff to assist with self feeding Compensations: Minimize environmental distractions;Slow rate;Small sips/bites Postural Changes and/or Swallow Maneuvers: Seated upright 90 degrees                Oral Care Recommendations: Oral care BID Follow up Recommendations: Skilled Nursing facility SLP Visit Diagnosis: Dysphagia, unspecified (R13.10) Plan: Continue with current plan of care       [place] 05/15/2017, 1:55 PM

## 2017-05-15 NOTE — Progress Notes (Signed)
Pharmacy Antibiotic Note  [name] is a 84 y.o. female admitted on 05/08/2017 with worsening SOB and was initially started on Vancomycin and Cefepime for presumed PNA.  Pt was also found to have a pleural effusion, recommending thoracentesis (pt currently refusing).  Clinically, pt was improving until overnight with development of tachycardia and hypotension.  Pharmacy has been consulted to restart empiric antibiotics, vancomycin and cefepime, for pna.  Vancomycin trough goal 15-20  Plan: 1) Vancomycin 1500mg  IV x 1 then 750mg  IV q12 2) Cefepime 2g IV q12h 3) Follow renal function, cultures, LOT, level if needed  Height: 5\' 3"  (160 cm) Weight: 168 lb 6.9 oz (76.4 kg) IBW/kg (Calculated) : 52.4  Temp (24hrs), Avg:98.8 F (37.1 C), Min:97.6 F (36.4 C), Max:100.1 F (37.8 C)  Recent Labs  Lab 05/10/17 0432 05/11/17 0743 05/12/17 0701 05/13/17 0205 05/14/17 0453 05/15/17 0306  WBC 3.7* 3.7* 3.5* 4.9  --  4.8  CREATININE 0.75 0.66 0.46 0.41* 0.44  --     Estimated Creatinine Clearance: 51.2 mL/min (by C-G formula based on SCr of 0.44 mg/dL).    Not on File  Antimicrobials this admission: Vanc 2/18 >> 2/19, restart 2/25 Cefepime 2/18 >> 2/19, restart 2/25  Dose adjustments this admission: n/a  Microbiology results: 2/18 blood cx >> negative  Thank you for allowing pharmacy to be a part of this patient's care.  Elmer Ramp 05/15/2017 10:02 AM

## 2017-05-15 NOTE — Care Management Important Message (Signed)
Important Message  Patient Details  Name: [name] MRN: [id number] Date of Birth: 10/02/1930   Medicare Important Message Given:  Yes    Jairy Angulo 05/15/2017, 1:00 PM

## 2017-05-16 ENCOUNTER — Inpatient Hospital Stay (HOSPITAL_COMMUNITY): Payer: Medicare Other

## 2017-05-16 DIAGNOSIS — G249 Dystonia, unspecified: Secondary | ICD-10-CM

## 2017-05-16 DIAGNOSIS — R259 Unspecified abnormal involuntary movements: Secondary | ICD-10-CM

## 2017-05-16 DIAGNOSIS — J9622 Acute and chronic respiratory failure with hypercapnia: Secondary | ICD-10-CM

## 2017-05-16 DIAGNOSIS — J9621 Acute and chronic respiratory failure with hypoxia: Secondary | ICD-10-CM

## 2017-05-16 DIAGNOSIS — I5033 Acute on chronic diastolic (congestive) heart failure: Secondary | ICD-10-CM

## 2017-05-16 LAB — COMPREHENSIVE METABOLIC PANEL
ALBUMIN: 2.2 g/dL — AB (ref 3.5–5.0)
ALK PHOS: 67 U/L (ref 38–126)
ALT: 8 U/L — ABNORMAL LOW (ref 14–54)
ANION GAP: 11 (ref 5–15)
AST: 12 U/L — ABNORMAL LOW (ref 15–41)
BILIRUBIN TOTAL: 0.6 mg/dL (ref 0.3–1.2)
BUN: 11 mg/dL (ref 6–20)
CALCIUM: 8.4 mg/dL — AB (ref 8.9–10.3)
CO2: 33 mmol/L — ABNORMAL HIGH (ref 22–32)
Chloride: 95 mmol/L — ABNORMAL LOW (ref 101–111)
Creatinine, Ser: 0.42 mg/dL — ABNORMAL LOW (ref 0.44–1.00)
GFR calc Af Amer: >60 mL/min (ref >60)
GLUCOSE: 111 mg/dL — AB (ref 65–99)
POTASSIUM: 3.6 mmol/L (ref 3.5–5.1)
Sodium: 139 mmol/L (ref 135–145)
TOTAL PROTEIN: 6.8 g/dL (ref 6.5–8.1)

## 2017-05-16 MED ORDER — POTASSIUM CHLORIDE CRYS ER 20 MEQ PO TBCR
40.0000 meq | EXTENDED_RELEASE_TABLET | Freq: Once | ORAL | Status: AC
  Administered 2017-05-16: 40 meq via ORAL
  Filled 2017-05-16: qty 2

## 2017-05-16 MED ORDER — CLONAZEPAM 0.5 MG PO TABS
0.5000 mg | ORAL_TABLET | Freq: Two times a day (BID) | ORAL | Status: DC | PRN
  Administered 2017-05-16: 0.5 mg via ORAL
  Filled 2017-05-16: qty 1

## 2017-05-16 MED ORDER — FUROSEMIDE 10 MG/ML IJ SOLN
40.0000 mg | Freq: Once | INTRAMUSCULAR | Status: DC
  Filled 2017-05-16: qty 4

## 2017-05-16 NOTE — Progress Notes (Signed)
Met with patient and son, [name] at bedside. Discussed hospital diagnoses, interventions, and underlying co-morbidities. MOST form completed. Patient continuously tells [place] she wants to go home (back to [facility]). Introduced hospice services. [name] agreeable to discharge back to SNF with hospice services, understanding transition to comfort focused care including symptom management and preventing re-hospitalization. SW consulted. Full note to follow.   NO CHARGE  Ihor Dow, FNP-C Palliative Medicine Team  Phone: [phone] Fax: [phone]

## 2017-05-16 NOTE — Progress Notes (Signed)
Pt noted to be hypotensive.  Pt alert oriented and able to follow commands.  Pt able to state name, dob, states "I'm in the hospital."  Mentation at baseline.  Denies dizziness, chest pain, worsening shortness of breath.  Will recheck BP in 30 minutes and continue to monitor.    05/16/17 0240  Vitals  BP (!) 86/55  MAP (mmHg) 66  Pulse Rate (!) 101  ECG Heart Rate (!) 101  Resp (!) 22  Oxygen Therapy  SpO2 98 %

## 2017-05-16 NOTE — Progress Notes (Signed)
                                                                                                                                                                                                         Daily Progress Note   Patient Name: [name]       Date: 05/17/17 DOB: 04/01/1930  Age: 84 y.o. MRN#: [id number] Attending Physician: Abrol, Nayana, MD Primary Care Physician: Paterson, Daniel, MD Admit Date: 05/08/2017  Reason for Consultation/Follow-up: Establishing goals of care  Subjective: Patient awake, alert, oriented. Participates in conversation. C/o of anxiety. Notified RN to give prn clonazepam. Patient ate bites of food that I offered her.   GOC:  Met with son, Mike at bedside. Introduced palliative medicine. Discussed events leading up to hospitalization and course of hospital diagnoses and interventions. I explained concern with acute on chronic respiratory failure secondary to CHF and ? Pulmonary hypertension. Patient refusing ECHO. Explained chronic progressive nature of heart failure and her being high risk for recurrent hospitalizations. Mike seems to have a good understanding of functional decline and that it will be very challenging to return to previous baseline after this hospitalization and with multiple co-morbidities.   Throughout the conversation, patient continuously tells us she wants to go home (back to [facility] where she has lived for many years). She continues to refuse interventions. She remains anxious and states relief from prn xanax. Mike is worried about her "overmedicating" and causing shallow respirations and respiratory distress. At this point we discussed what quality of life means for her, and focus on comfort/symptom management may give her a better quality of life over continued interventions that are not helping her feel better.   Mike is documented HCPOA. Discussed MOST form which he discussed with siblings. Mike confirms DNR/DNI, limited  interventions, IVF/ABX if indicated, and no feeding tube.   Attempted to determine was is most important to patient and son--going back to [facility]. Educated on palliative versus hospice services outpatient. Explained that hospice will be the best support for them outpatient to focus on her comfort, quality, symptom management, and preventing re-hospitalization. I did explain hospice philosophy with focus on comfort, quality, and dignity. Patient becomes tearful and states "I'm dying." Mike states "we are all dying" and we consoled his mother.  Mike is agreeable with hospice services at SNF on discharge. He is interested in having HS CPAP as recommended by Dr. Ogbata. We reviewed her medications.   Mike is also hopeful his mother will complete ECHO tomorrow.   Answered questions and concerns. Offered   emotional support and therapeutic listening as Mike shares stories of his mother.    Length of Stay: 9  Current Medications: Scheduled Meds:  . docusate sodium  100 mg Oral QHS  . enoxaparin (LOVENOX) injection  40 mg Subcutaneous Q24H  . gabapentin  100 mg Oral TID  . OLANZapine  5 mg Oral QHS    Continuous Infusions:  PRN Meds: acetaminophen, bisacodyl, clonazePAM, ipratropium-albuterol, metoprolol tartrate, MUSCLE RUB, ondansetron **OR** ondansetron (ZOFRAN) IV, polyvinyl alcohol, RESOURCE THICKENUP CLEAR, RESOURCE THICKENUP CLEAR  Physical Exam  Constitutional: She is oriented to person, place, and time. She is cooperative. She appears ill.  HENT:  Head: Normocephalic and atraumatic.  Cardiovascular: Regular rhythm.  hypotensive  Pulmonary/Chest: No accessory muscle usage. No tachypnea. No respiratory distress. She has decreased breath sounds.  Neurological: She is alert and oriented to person, place, and time.  Skin: Skin is warm and dry. There is pallor.  Psychiatric: Her mood appears anxious. Her speech is slurred.  Nursing note and vitals reviewed.           Vital Signs: BP  (!) 87/52 (BP Location: Right Arm)   Pulse 90   Temp 98.7 F (37.1 C) (Axillary)   Resp 17   Ht 5' 3" (1.6 m)   Wt 76.4 kg (168 lb 6.9 oz)   SpO2 94%   BMI 29.84 kg/m  SpO2: SpO2: 94 % O2 Device: O2 Device: Nasal Cannula O2 Flow Rate: O2 Flow Rate (L/min): 5 L/min  Intake/output summary:   Intake/Output Summary (Last 24 hours) at 05/17/2017 0806 Last data filed at 05/17/2017 0430 Gross per 24 hour  Intake 250 ml  Output -  Net 250 ml   LBM: Last BM Date: 05/15/17 Baseline Weight: Weight: 88.9 kg (196 lb) Most recent weight: Weight: 76.4 kg (168 lb 6.9 oz)       Palliative Assessment/Data: PPS 30%   Flowsheet Rows     Most Recent Value  Intake Tab  Referral Department  Hospitalist  Unit at Time of Referral  Med/Surg Unit  Palliative Care Primary Diagnosis  Cardiac  Palliative Care Type  Return patient Palliative Care  Reason for referral  Clarify Goals of Care  Date first seen by Palliative Care  05/12/17  Clinical Assessment  Palliative Performance Scale Score  30%  Psychosocial & Spiritual Assessment  Palliative Care Outcomes  Patient/Family meeting held?  Yes  Who was at the meeting?  patient and son  Palliative Care Outcomes  Clarified goals of care, Provided end of life care assistance, Provided psychosocial or spiritual support, ACP counseling assistance, Counseled regarding hospice, Provided advance care planning      Patient Active Problem List   Diagnosis Date Noted  . Hypoxia   . Congestive heart failure ([facility])   . Palliative care by specialist   . Goals of care, counseling/discussion   . Pressure injury of skin 05/11/2017  . Paranoia ([facility])   . Acute respiratory failure with hypoxia ([facility]) 05/08/2017  . HCAP (healthcare-associated pneumonia) 05/08/2017  . Pleural effusion 05/08/2017  . Dystonia 05/08/2017  . FLATULENCE-GAS-BLOATING 03/04/2010  . PERSONAL HX COLONIC POLYPS 03/04/2010  . BACK PAIN 07/10/2008  . MONOCLONAL GAMMOPATHY 11/03/2006  .  HEMATURIA 11/03/2006  . Abnormal involuntary movement 11/03/2006  . FX CLOSED VERTEBRA NOS 11/03/2006  . Essential hypertension 09/21/2006    Palliative Care Assessment & Plan   Patient Profile: 84 y.o. female  with past medical history of dystonia of neck with spasticity treated with botox, monoclonal gammopathy,   hypertension, depression, L3 compression fracture, chronic home oxygen admitted on 05/08/2017 from SNF with shortness of breath and hypoxia. Found to have acute on chronic respiratory failure secondary to pleural effusion, CHF, and possible HCAP (less likely and antibiotics have been discontinued). On 2/20, patient became minimally responsive. ABG revealed severe hypercarbia and BiPAP placed. Psychotropic medications were on hold. Psych has evaluated and restarted at lower doses. Patient stable on nasal cannula. Palliative medicine consultation for goals of care.   Assessment: Acute on chronic hypercapnic & hypoxic respiratory failure Pleural effusion HCAP Acute on chronic diastolic CHF Dysphagia Severe dystonia Anxiety Bipolar disorder/schizophrenia Pulmonary hypertension  Recommendations/Plan:  MOST form completed. DNR/DNI, limited interventions, ABX/IVF if indicated, no feeding tube.   Continue dysphagia diet. Son understands risk for aspiration. Patient tolerating food while I was at bedside.   Discussed course of hospitalization including diagnoses and interventions. Explained high risk for recurrent hospitalization and transition to comfort focused care. Will continue current interventions.   Discussed palliative versus hospice options. Son requesting hospice services on discharge. SW notified.   Son is hopeful his mother will allow ECHO to be completed in AM.   PMT will follow.   Goals of Care and Additional Recommendations:  DNR/DNI. Continue medical management  Code Status: DNR/DNI   Code Status Orders  (From admission, onward)        Start      Ordered   05/08/17 1442  Do not attempt resuscitation (DNR)  Continuous    Question Answer Comment  In the event of cardiac or respiratory ARREST Do not call a "code blue"   In the event of cardiac or respiratory ARREST Do not perform Intubation, CPR, defibrillation or ACLS   In the event of cardiac or respiratory ARREST Use medication by any route, position, wound care, and other measures to relive pain and suffering. May use oxygen, suction and manual treatment of airway obstruction as needed for comfort.      05/08/17 1445    Code Status History    Date Active Date Inactive Code Status Order ID Comments User Context   This patient has a current code status but no historical code status.    Advance Directive Documentation     Most Recent Value  Type of Advance Directive  Out of facility DNR (pink MOST or yellow form)  Pre-existing out of facility DNR order (yellow form or pink MOST form)  Yellow form placed in chart (order not valid for inpatient use)  "MOST" Form in Place?  No data       Prognosis:   Guarded with acute on chronic respiratory failure secondary to diastolic CHF, HCAP, pleural effusions  Discharge Planning:  Back to facility with hospice services  Care plan was discussed with patient and son (Mike)   Thank you for allowing the Palliative Medicine Team to assist in the care of this patient.   Time In: 1600 Time Out: 1745 Total Time 105min Prolonged Time Billed  yes      Greater than 50%  of this time was spent counseling and coordinating care related to the above assessment and plan.   , FNP-C Palliative Medicine Team  Phone: [phone] Fax: [phone]  Please contact Palliative Medicine Team phone at [phone] for questions and concerns.      

## 2017-05-16 NOTE — Progress Notes (Signed)
Pt noted to desat to 84% on 3.5L HFNC.  Pt tachypneic w/ RR 24-33 but pt denies increased SOB, discomfort, or chest pain.  Pt states "I'm nervous." HFNC increased to 7L.  Attempted to place pt on bipap for tachypnea and oxygen desaturation.  Pt refusing bipap.  Pt stating "no more, I don't want any mask, it scares me."

## 2017-05-16 NOTE — Progress Notes (Signed)
Palliative NP to meet with son, [name] this afternoon at 4pm to discuss goals of care.  NO CHARGE  Ihor Dow, FNP-C Palliative Medicine Team  Phone: [phone] Fax: [phone]

## 2017-05-16 NOTE — Progress Notes (Signed)
Attempted to do echocardiogram.  Patient refused and has refused multiple times according to notes.  Nurse was again notified.

## 2017-05-16 NOTE — Progress Notes (Signed)
[facility] Progress Note  Patient: [name] MWU:[id number]   PCP: Leanna Battles, MD DOB: 1931/03/04   DOA: 05/08/2017   DOS: 05/16/2017   Date of Service: the patient was seen and examined on 05/16/2017  Subjective: Patient seen alongside patient's nurse. Patient noted to be hypotensive, complaining of shortness of breath this morning, systolic blood pressure has been running in the 80s and 90s. She refused 2-D echo during the week. Pro-calcitonin found to be negative. MRSA PCR negative. Oxygen requirements up to 7 L now, continues to refuse all procedures      Brief hospital course: Pt. with PMH of dystonia with spasticity, monoclonal gammopathy, HTN, depression, anxiety, L3 fracture, chronic respiratory failure; admitted on 05/08/2017, presented with complaint of shortness of breath, was found to have acute on chronic hypoxic respiratory failure due to pleural effusion. Currently further plan is continue further adjustment of medications.  Assessment and Plan: 1. Acute on chronic combined hypercapnic and hypoxic respiratory failure. Left-sided pleural effusion. Suspected healthcare associated pneumonia. Acute on chronic diastolic CHF. Patient presents with complaints of shortness of breath as well as hypoxia. Uses 2 L of oxygen at baseline.  Initial chest x-ray showed opacification of the left lung base, CT chest on 2/22 showed right greater than left pleural effusions with partial consolidation in bilateral lower lobe suspicious for pneumonia. Patient also found to have pericardial effusion up to 17 mm. Patient refused 2-D   to be evaluated for pulmonary hypertension, she also refused thoracentesis. Patient also deemed to have a component of CHF exacerbation with difficulty using diuretics due to low blood pressure  ABG on 2/22 showed pH of 7.38, PCO2 of 67 improved from 108 on 2/20 Patient found to have concomitant aspiration and is currently on a modified diet Completed  treatment for pneumonia with vancomycin and cefepime from 2/18-2/25 Given shortness of breath again today, attempted to give  Lasix 40 mg IV 1, but unable due to low BP , encourage family to meet with palliative care for goals of care  2.  Severe dystonia. Dysphagia. Patient has severe dystonia and does get Botox injections on a regular basis. Did not get any injections in last 6 months.  Generally her duration is every 3 months. Dysphagia 1 diet with nectar thick liquids recommended by speech therapy Patient remains at high risk for aspiration.  Family aware.    3.  Acute  Metabolic Encephalopathy. Acute on chronic hypercarbic respiratory failure. Patient become minimally responsive on 05/10/2017 in the morning. The night prior to that patient received her regular routine medications. It appears that the patient is unable to tolerate her home regimen. Holding all psychotropic medications for now. ABG was showing severe hypercarbia, patient was placed on BiPAP, repeat ABG shows chronic compensated hypercarbic respiratory failure. Encephalopathy is likely multifactorial.    4.  HTN. Patient is on Inderal, held due to hypotension medications  .   5.Anemia of chronic disease. H&H stable.  Monitor.  6.Anxiety. Bipolar disorder,Schizophrenia She has history of chronic paranoia with prior suicide attempts, currently denies suicidal ideation Seen by psychiatry on 2/21, medications adjusted  Continue Zyprexa 5 mg daily at bedtime for psychosis As needed Klonopin for anxiety     7.  Pulmonary hypertension. This likely secondary to pulmonary disease. Patient has chronic combined hypercapnic and hypoxemic respiratory failure. Declined 2-D echo    9. Hypokalemia: repleted. K-Dur 26 M EQ  Continue to monitor renal function and electrolytes.  Diet: Dysphagia 1 diet  DVT Prophylaxis:  subcutaneous Heparin  Advance goals of care discussion: DNR DNI.  Palliative care input is  appreciated. Family Communication:  Communicated with son this admission  Disposition: Palliative care recommendations pending, anticipate discharge in 1-2 days    Consultants: Palliative care  Procedures: none  Antibiotics: Anti-infectives (From admission, onward)   Start     Dose/Rate Route Frequency Ordered Stop   05/15/17 2200  vancomycin (VANCOCIN) IVPB 750 mg/150 ml premix     750 mg 150 mL/hr over 60 Minutes Intravenous Every 12 hours 05/15/17 1007     05/15/17 1015  vancomycin (VANCOCIN) 1,500 mg in sodium chloride 0.9 % 500 mL IVPB     1,500 mg 250 mL/hr over 120 Minutes Intravenous  Once 05/15/17 1007 05/15/17 1332   05/15/17 1000  ceFEPIme (MAXIPIME) 500 mg in dextrose 5 % 50 mL IVPB    Comments:  Pharmacy to dose   500 mg 100 mL/hr over 30 Minutes Intravenous Every 12 hours 05/15/17 0815     05/09/17 1400  vancomycin (VANCOCIN) 1,250 mg in sodium chloride 0.9 % 250 mL IVPB  Status:  Discontinued     1,250 mg 166.7 mL/hr over 90 Minutes Intravenous Every 24 hours 05/08/17 1429 05/09/17 1420   05/08/17 2200  ceFEPIme (MAXIPIME) 1 g in sodium chloride 0.9 % 100 mL IVPB  Status:  Discontinued     1 g 200 mL/hr over 30 Minutes Intravenous Every 8 hours 05/08/17 1429 05/09/17 1420   05/08/17 1430  vancomycin (VANCOCIN) 1,500 mg in sodium chloride 0.9 % 500 mL IVPB     1,500 mg 250 mL/hr over 120 Minutes Intravenous  Once 05/08/17 1427 05/08/17 2030   05/08/17 1400  ceFEPIme (MAXIPIME) 2 g in sodium chloride 0.9 % 100 mL IVPB     2 g 200 mL/hr over 30 Minutes Intravenous  Once 05/08/17 1357 05/08/17 1522   05/08/17 1400  vancomycin (VANCOCIN) IVPB 1000 mg/200 mL premix  Status:  Discontinued     1,000 mg 200 mL/hr over 60 Minutes Intravenous  Once 05/08/17 1357 05/08/17 1427       Objective: Physical Exam: Vitals:   05/16/17 0240 05/16/17 0306 05/16/17 0437 05/16/17 0736  BP: (!) 86/55 95/67 97/83  (!) 90/58  Pulse: (!) 101 100 95   Resp: (!) 22 (!) 27 (!) 26     Temp:  98.6 F (37 C)    TempSrc:  Oral    SpO2: 98% 96% 95%   Weight:      Height:        Intake/Output Summary (Last 24 hours) at 05/16/2017 0811 Last data filed at 05/16/2017 0400 Gross per 24 hour  Intake 250 ml  Output 300 ml  Net -50 ml   Filed Weights   05/09/17 0129 05/12/17 0349 05/14/17 0033  Weight: 76.2 kg (167 lb 15.9 oz) 79.4 kg (175 lb 0.7 oz) 76.4 kg (168 lb 6.9 oz)   General: Awake and alert.  Not in any distress. Eyes: PERRL, Conjunctiva normal ENT: Oral Mucosa clear moist. Neck:  Can not assess JVD Cardiovascular: S1 and S2. ESM.   Respiratory: Decreased air entry, increased accessory muscle use   Abdomen: Obese, soft and nontender.  Organs are difficult to assess.  Bowel sounds are present.   Extremities: No Leg edema. Neurologic: Patient is awake and alert.  Data Reviewed: CBC: Recent Labs  Lab 05/10/17 0432 05/11/17 0743 05/12/17 0701 05/13/17 0205 05/15/17 0306  WBC 3.7* 3.7* 3.5* 4.9 4.8  NEUTROABS 1.8 2.4 2.2 3.3  2.8  HGB 8.8* 9.1* 9.3* 10.1* 8.9*  HCT 28.7* 30.5* 31.5* 33.3* 30.0*  MCV 107.1* 109.7* 108.2* 105.7* 107.5*  PLT 152 147* 171 200 616   Basic Metabolic Panel: Recent Labs  Lab 05/10/17 0432 05/11/17 0743 05/12/17 0701 05/12/17 1048 05/13/17 0205 05/14/17 0453  NA 141 143 144  --  141 140  K 3.7 3.8 3.6  --  3.2* 3.7  CL 96* 99* 98*  --  89* 90*  CO2 35* 31 36*  --  39* 39*  GLUCOSE 98 93 118*  --  117* 106*  BUN 9 14 14   --  10 10  CREATININE 0.75 0.66 0.46  --  0.41* 0.44  CALCIUM 8.5* 8.4* 8.8*  --  8.7* 8.5*  MG 1.8 1.7 1.8  --   --   --   PHOS  --   --   --  2.1* 1.9* 2.6    Liver Function Tests: Recent Labs  Lab 05/10/17 0432 05/12/17 0701 05/13/17 0205 05/14/17 0453  AST 16 14*  --   --   ALT 11* 12*  --   --   ALKPHOS 82 77  --   --   BILITOT 0.7 0.6  --   --   PROT 6.5 6.7  --   --   ALBUMIN 2.4* 2.4* 2.6* 2.4*   No results for input(s): LIPASE, AMYLASE in the last 168 hours. No results for  input(s): AMMONIA in the last 168 hours. Coagulation Profile: No results for input(s): INR, PROTIME in the last 168 hours. Cardiac Enzymes: No results for input(s): CKTOTAL, CKMB, CKMBINDEX, TROPONINI in the last 168 hours. BNP (last 3 results) No results for input(s): PROBNP in the last 8760 hours. CBG: Recent Labs  Lab 05/13/17 0007 05/13/17 0359 05/13/17 1938 05/14/17 0014 05/14/17 0349  GLUCAP 116* 99 105* 134* 97   Studies: No results found.  Scheduled Meds: . ceFEPIme (MAXIPIME) IVPB  500 mg Intravenous Q12H  . docusate sodium  100 mg Oral QHS  . enoxaparin (LOVENOX) injection  40 mg Subcutaneous Q24H  . furosemide  40 mg Intravenous Once  . gabapentin  100 mg Oral TID  . OLANZapine  5 mg Oral QHS  . potassium chloride  40 mEq Oral Once   Continuous Infusions: . vancomycin Stopped (05/15/17 2311)   PRN Meds: acetaminophen, ALPRAZolam, bisacodyl, ipratropium-albuterol, metoprolol tartrate, MUSCLE RUB, ondansetron **OR** ondansetron (ZOFRAN) IV, polyvinyl alcohol, RESOURCE THICKENUP CLEAR, RESOURCE THICKENUP CLEAR  Time spent: 35 minutes       05/16/2017 8:11 AM  If 7PM-7AM, please contact night-coverage at [email or web address], password [facility]

## 2017-05-16 NOTE — Progress Notes (Signed)
CSW updated by MD that patient will need CPAP at night upon return to [facility]. CSW contacted Admissions at [facility] to confirm that facility could handle that, and what documentation was needed. CSW to update FL2 and sent to facility. MD will need to place the CPAP on the discharge summary when ready to return to facility, as well.  CSW will continue to follow.  Laveda Abbe, [place] Clinical Social Worker [phone]

## 2017-05-16 NOTE — Care Management Note (Signed)
Case Management Note  Patient Details  Name: [name] MRN: [id number] Date of Birth: Sep 27, 1930  Subjective/Objective:   Pt admitted with respiratory distress. She is from [facility].                Action/Plan: Plan is for patient to return to [facility] when medically stable. CM following.   Expected Discharge Date:                  Expected Discharge Plan:  Skilled Nursing Facility  In-House Referral:  Clinical Social Work  Discharge planning Services     Post Acute Care Choice:    Choice offered to:     DME Arranged:    DME Agency:     HH Arranged:    [place] Agency:     Status of Service:  In process, will continue to follow  If discussed at Long Length of Stay Meetings, dates discussed:    Additional Comments:  Pollie Friar, RN 05/16/2017, 12:47 PM

## 2017-05-16 NOTE — Progress Notes (Signed)
Pt mentation continues at baseline.  A&Ox3.  Pt with no complaints.  BP increased from previous values.     05/16/17 0306  Vitals  BP 95/67  MAP (mmHg) 77  Pulse Rate 100  ECG Heart Rate 99  Resp (!) 27  Oxygen Therapy  SpO2 96 %

## 2017-05-17 ENCOUNTER — Inpatient Hospital Stay (HOSPITAL_COMMUNITY): Payer: Medicare Other

## 2017-05-17 DIAGNOSIS — I361 Nonrheumatic tricuspid (valve) insufficiency: Secondary | ICD-10-CM

## 2017-05-17 LAB — ECHOCARDIOGRAM COMPLETE
Height: 63 in
Weight: 2694.9 oz

## 2017-05-17 LAB — BASIC METABOLIC PANEL
Anion gap: 8 (ref 5–15)
BUN: 9 mg/dL (ref 6–20)
CHLORIDE: 96 mmol/L — AB (ref 101–111)
CO2: 35 mmol/L — ABNORMAL HIGH (ref 22–32)
CREATININE: 0.42 mg/dL — AB (ref 0.44–1.00)
Calcium: 8.4 mg/dL — ABNORMAL LOW (ref 8.9–10.3)
GFR calc Af Amer: >60 mL/min (ref >60)
Glucose, Bld: 105 mg/dL — ABNORMAL HIGH (ref 65–99)
Potassium: 3.9 mmol/L (ref 3.5–5.1)
SODIUM: 139 mmol/L (ref 135–145)

## 2017-05-17 MED ORDER — GABAPENTIN 100 MG PO CAPS: 100.0000 mg | ORAL_CAPSULE | Freq: Three times a day (TID) | ORAL | 0 refills | Status: AC

## 2017-05-17 MED ORDER — OLANZAPINE 5 MG PO TABS: 5.0000 mg | ORAL_TABLET | Freq: Every day | ORAL | 1 refills | Status: AC

## 2017-05-17 MED ORDER — SODIUM CHLORIDE 0.9 % IV BOLUS (SEPSIS)
250.0000 mL | Freq: Once | INTRAVENOUS | Status: AC
  Administered 2017-05-17: 250 mL via INTRAVENOUS

## 2017-05-17 MED ORDER — ALPRAZOLAM 0.5 MG PO TABS: 0.5000 mg | ORAL_TABLET | Freq: Three times a day (TID) | ORAL | 0 refills | Status: AC

## 2017-05-17 NOTE — Progress Notes (Signed)
Daily Progress Note   Patient Name: [name]       Date: 05/17/17 DOB: Jan 18, 1931  Age: 84 y.o. MRN#: [id number] Attending Physician: No att. providers found Primary Care Physician: Leanna Battles, MD Admit Date: 05/08/2017  Reason for Consultation/Follow-up: Establishing goals of care  Subjective: Patient awake, alert, oriented. She completed ECHO this morning. Denies pain or discomfort during visit.    GOC:  Spoke with [name] via telephone to confirm plan for discharge back to [facility] with hospice. Updated on episode of hypotension and dyspnea last night, requiring fluid bolus and BiPAP, for which she refused. I explained that blood pressure remains low today and guarded prognosis (likely weeks) with ongoing heart failure and hypotension. I encouraged [name] to prepare his siblings. [name] is not surprised to hear of guarded prognosis. Again discussed hospice philosophy with focus on comfort, quality, and dignity at EOL. Also symptom management and preventing re-hospitalization.   Answered questions and concerns.    Length of Stay: 9  Current Medications: Scheduled Meds:  . docusate sodium  100 mg Oral QHS  . enoxaparin (LOVENOX) injection  40 mg Subcutaneous Q24H  . gabapentin  100 mg Oral TID  . OLANZapine  5 mg Oral QHS    Continuous Infusions:  PRN Meds: acetaminophen, bisacodyl, clonazePAM, ipratropium-albuterol, metoprolol tartrate, MUSCLE RUB, ondansetron **OR** ondansetron (ZOFRAN) IV, polyvinyl alcohol, RESOURCE THICKENUP CLEAR, RESOURCE THICKENUP CLEAR  Physical Exam  Constitutional: She is oriented to person, place, and time. She is cooperative. She appears ill.  HENT:  Head: Normocephalic and atraumatic.  Cardiovascular: Regular rhythm.  hypotensive    Pulmonary/Chest: No accessory muscle usage. No tachypnea. No respiratory distress. She has decreased breath sounds.  Neurological: She is alert and oriented to person, place, and time.  Skin: Skin is warm and dry. There is pallor.  Psychiatric: Her mood appears anxious. Her speech is slurred.  Nursing note and vitals reviewed.           Vital Signs: BP 90/62 (BP Location: Right Arm)   Pulse 90   Temp 98.7 F (37.1 C) (Axillary)   Resp 17   Ht 5\' 3"  (1.6 m)   Wt 76.4 kg (168 lb 6.9 oz)   SpO2 94%   BMI 29.84 kg/m  SpO2: SpO2: 94 % O2 Device: O2 Device: Nasal Cannula O2  Flow Rate: O2 Flow Rate (L/min): 5 L/min  Intake/output summary:   Intake/Output Summary (Last 24 hours) at 05/17/2017 1300 Last data filed at 05/17/2017 0430 Gross per 24 hour  Intake 250 ml  Output -  Net 250 ml   LBM: Last BM Date: 05/15/17 Baseline Weight: Weight: 88.9 kg (196 lb) Most recent weight: Weight: 76.4 kg (168 lb 6.9 oz)       Palliative Assessment/Data: PPS 30%   Flowsheet Rows     Most Recent Value  Intake Tab  Referral Department  Hospitalist  Unit at Time of Referral  Med/Surg Unit  Palliative Care Primary Diagnosis  Cardiac  Palliative Care Type  Return patient Palliative Care  Reason for referral  Clarify Goals of Care  Date first seen by Palliative Care  05/12/17  Clinical Assessment  Palliative Performance Scale Score  30%  Psychosocial & Spiritual Assessment  Palliative Care Outcomes  Patient/Family meeting held?  Yes  Who was at the meeting?  patient and son  Palliative Care Outcomes  Clarified goals of care, Provided end of life care assistance, Provided psychosocial or spiritual support, ACP counseling assistance, Counseled regarding hospice, Provided advance care planning      Patient Active Problem List   Diagnosis Date Noted  . Hypoxia   . Congestive heart failure ([place])   . Palliative care by specialist   . Goals of care, counseling/discussion   . Pressure  injury of skin 05/11/2017  . Paranoia ([place])   . Acute on chronic respiratory failure with hypoxia and hypercapnia ([place]) 05/08/2017  . HCAP (healthcare-associated pneumonia) 05/08/2017  . Pleural effusion 05/08/2017  . Dystonia 05/08/2017  . FLATULENCE-GAS-BLOATING 03/04/2010  . PERSONAL HX COLONIC POLYPS 03/04/2010  . BACK PAIN 07/10/2008  . MONOCLONAL GAMMOPATHY 11/03/2006  . HEMATURIA 11/03/2006  . Abnormal involuntary movement 11/03/2006  . FX CLOSED VERTEBRA NOS 11/03/2006  . Essential hypertension 09/21/2006    Palliative Care Assessment & Plan   Patient Profile: 84 y.o. female  with past medical history of dystonia of neck with spasticity treated with botox, monoclonal gammopathy, hypertension, depression, L3 compression fracture, chronic home oxygen admitted on 05/08/2017 from SNF with shortness of breath and hypoxia. Found to have acute on chronic respiratory failure secondary to pleural effusion, CHF, and possible HCAP (less likely and antibiotics have been discontinued). On 2/20, patient became minimally responsive. ABG revealed severe hypercarbia and BiPAP placed. Psychotropic medications were on hold. Psych has evaluated and restarted at lower doses. Patient stable on nasal cannula. Palliative medicine consultation for goals of care.   Assessment: Acute on chronic hypercapnic & hypoxic respiratory failure Pleural effusion HCAP Acute on chronic diastolic CHF Dysphagia Severe dystonia Anxiety Bipolar disorder/schizophrenia Pulmonary hypertension  Recommendations/Plan:  DNR/DNI  MOST form in chart.   Again discussed hospital diagnoses, interventions, and underlying co-morbidities. Son understands guarded prognosis. Plan is for discharge back to SNF with hospice today.   Goals of Care and Additional Recommendations:  DNR/DNI. Continue medical management  Code Status: DNR/DNI   Code Status Orders  (From admission, onward)        Start     Ordered   05/08/17  1442  Do not attempt resuscitation (DNR)  Continuous    Question Answer Comment  In the event of cardiac or respiratory ARREST Do not call a "code blue"   In the event of cardiac or respiratory ARREST Do not perform Intubation, CPR, defibrillation or ACLS   In the event of cardiac or respiratory ARREST Use medication  by any route, position, wound care, and other measures to relive pain and suffering. May use oxygen, suction and manual treatment of airway obstruction as needed for comfort.      05/08/17 1445    Code Status History    Date Active Date Inactive Code Status Order ID Comments User Context   This patient has a current code status but no historical code status.    Advance Directive Documentation     Most Recent Value  Type of Advance Directive  Out of facility DNR (pink MOST or yellow form)  Pre-existing out of facility DNR order (yellow form or pink MOST form)  Yellow form placed in chart (order not valid for inpatient use)  "MOST" Form in Place?  No data       Prognosis:   Guarded with acute on chronic respiratory failure secondary to diastolic CHF, HCAP, pleural effusions  Discharge Planning:  Back to facility with hospice services  Care plan was discussed with patient and son [name])   Thank you for allowing the Palliative Medicine Team to assist in the care of this patient.   Time In: 0925 Time Out: 1000 Total Time 34min Prolonged Time Billed no      Greater than 50%  of this time was spent counseling and coordinating care related to the above assessment and plan.  Ihor Dow, FNP-C Palliative Medicine Team  Phone: [phone] Fax: [phone]  Please contact Palliative Medicine Team phone at [phone] for questions and concerns.

## 2017-05-17 NOTE — Progress Notes (Signed)
Patient discharged to [facility] with PTAR. IV removed. Report called to facility. Wendee Copp

## 2017-05-17 NOTE — Care Management Note (Signed)
Case Management Note  Patient Details  Name: MONISHA SIEBEL MRN: [id number] Date of Birth: [birth year]  Subjective/Objective:                 Patient with order to discharge to [facility] (SNF). SNF discharge facilitated through [facility] ([place]). Please refer to [place] notes for disposition plan and direct questions CSW on call accordingly. CM signing off   Action/Plan:   Expected Discharge Date:  05/17/17               Expected Discharge Plan:  [place]  In-House Referral:  Clinical Social Work  Discharge planning Services     Post Acute Care Choice:    Choice offered to:     DME Arranged:    DME Agency:     HH Arranged:    [place] Agency:     Status of Service:  Completed, signed off  If discussed at [employer], dates discussed:    Additional Comments:  Carles Collet, RN 05/17/2017, 10:29 AM

## 2017-05-17 NOTE — Progress Notes (Signed)
Pt noted to by hypotensive.  With BP as follows:    05/17/17 0057  Vitals  BP (!) 80/36  MAP (mmHg) (!) 50  BP Location Right Arm  BP Method Automatic  Pulse Rate 92  Pulse Rate Source Monitor  ECG Heart Rate 91  Resp (!) 25  Oxygen Therapy  SpO2 94 %  O2 Device HFNC  O2 Flow Rate (L/min) 7 L/min   Lamar Blinks, NP notified.  Orders received to give 250cc NS bolus to infuse over 30 minutes.  Pt alert, oriented and answering questions appropriately.  Pt c/o back pain but otherwise denies complaints.  No complaints of increased SOB or chest pain.  No increased WOB noted.

## 2017-05-17 NOTE — Progress Notes (Addendum)
Patient will discharge to [facility] Anticipated discharge date: 2/27 Family notified: [facility]ie [facility] [employer] by Corey Harold- called 11:10am Report #: [phone]  [place] signing off.  Jorge Ny, LCSW Clinical Social Worker [phone]

## 2017-05-17 NOTE — Progress Notes (Signed)
  Speech Language Pathology Treatment: Dysphagia  Patient Details Name: [name] MRN: [id number] DOB: Nov 19, 1930 Today's Date: 05/17/2017 Time: 7062-3762 SLP Time Calculation (min) (ACUTE ONLY): 13 min  Assessment / Plan / Recommendation Clinical Impression  Skilled treatment session focused on dysphagia goals. SLP received pt as echo was ending. Pt with slightly increased verbal interaction with SLP. Pt agreeable to some yogurt and consumed with minimal lingual manipulation but timely oral phase and complete oral clearing. SLP encouraged pt to consume graham crackers or ice water (mulitple offerings made). Pt refused and despite Max encouragement to consume 6 bites of yogurt. As SLP was leaving room, pt stated "please wait, aren't you supposed to be feeding me." SLP reviewed eating yogurt and offered pt more yogurt or any food items. But stated that she wasn't hungry.   Given pt's cognitive/psychological deficits and multiple medical diagnoses, it is unlikely that pt will consume enough PO intake to sustain nutrition. In addition, her cognitive/psychological deficits prevent her from attempting advanced textures of thin liquids, therefore it is likely that pt will discharge back to SNF on dysphagia 1 (puree) with nectar thick liquids.     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 Approximately 5 days ago patient was diagnosed with pneumoniaand UTIin the facility and placed on Levaquin.At baseline patient isusing supplemental oxygen 2 L/min [place],but due to worsening of shortness of breath was increased to 4 L and still patient was not comfortable. She denied  chest pain, chills,but still has productive cough. Recent chest xray revealed moderate cardiomegaly, left basilar opacity is noted concerning for atelectasis or infiltrate with associated pleural effusion. On exam, she has dystonia with atypical muscle movements of her extremities and facial muscles - this is baseline for her as per her son. Exam shows somewhat diminished breath sounds in the LLL but is otherwise unremarkable. Son also requests BH evaluation d/t mental illness.       SLP Plan  Continue with current plan of care       Recommendations  Diet recommendations: Dysphagia 1 (puree);Nectar-thick liquid Liquids provided via: Cup;Teaspoon Medication Administration: Whole meds with puree Supervision: Staff to assist with self feeding Compensations: Minimize environmental distractions;Slow rate;Small sips/bites Postural Changes and/or Swallow Maneuvers: Seated upright 90 degrees                Oral Care Recommendations: Oral care BID Follow up Recommendations: Skilled Nursing facility SLP Visit Diagnosis: Dysphagia, unspecified (R13.10) Plan: Continue with current plan of care       [place] 05/17/2017, 9:17 AM

## 2017-05-17 NOTE — Discharge Summary (Signed)
Physician Discharge Summary  [name] MRN: [id number] DOB/AGE: 06-14-1930 84 y.o.  PCP: Leanna Battles, MD   Admit date: 05/08/2017 Discharge date: 05/17/2017  Discharge Diagnoses:    Principal Problem:   Paranoia [facility]) Active Problems:   Essential hypertension   Abnormal involuntary movement   Acute on chronic respiratory failure with hypoxia and hypercapnia (HCC)   HCAP (healthcare-associated pneumonia)   Pleural effusion   Dystonia   Pressure injury of skin   Hypoxia   Congestive heart failure ([place])   Palliative care by specialist   Goals of care, counseling/discussion    Follow-up recommendations Follow-up with PCP in 3-5 days , including all  additional recommended appointments as below Follow-up CBC, CMP in 3-5 days Patient is being discharged to SNF with hospice services  Diet recommendations: Dysphagia 1 (puree);Nectar-thick liquid Liquids provided via: Cup Medication Administration: Whole meds with puree Supervision: Staff to assist with self feeding Compensations: Minimize environmental distractions;Slow rate;Small sips/bites Postural Changes and/or Swallow Maneuvers: Seated upright 90 degrees   Goals of care  MOST form completed. DNR/DNI, limited interventions, ABX/IVF if indicated, no feeding tube.   Continue dysphagia diet. Son understands risk for aspiration.   Son requesting hospice services on discharge. Son notified of discharge plan with hospice services at SNF. Notified of expected gradual decline after discharge      Allergies as of 05/17/2017   Not on File     Medication List    STOP taking these medications   levofloxacin 750 MG tablet Commonly known as:  LEVAQUIN   Melatonin 1 MG Tabs   orlistat 60 MG capsule Commonly known as:  ALLI   phenylephrine-shark liver oil-mineral oil-petrolatum 0.25-3-14-71.9 % rectal ointment Commonly known as:  PREPARATION H   propranolol 10 MG tablet Commonly known as:  INDERAL     TAKE  these medications   acetaminophen 500 MG tablet Commonly known as:  TYLENOL Take 1,000 mg by mouth 2 (two) times daily.   acetaminophen 325 MG tablet Commonly known as:  TYLENOL Take 650 mg by mouth daily as needed for fever.   albuterol (2.5 MG/3ML) 0.083% nebulizer solution Commonly known as:  PROVENTIL Take 2.5 mg by nebulization every 4 (four) hours as needed for wheezing or shortness of breath.   ALPRAZolam 0.5 MG tablet Commonly known as:  XANAX Take 1 tablet (0.5 mg total) by mouth 3 (three) times daily.   ARTIFICIAL TEARS OP Place 2 drops into both eyes every 8 (eight) hours as needed (dryness).   aspirin 81 MG chewable tablet Chew 81 mg by mouth daily.   bisacodyl 10 MG suppository Commonly known as:  DULCOLAX Place 10 mg rectally as needed for mild constipation or moderate constipation.   Cranberry 450 MG Tabs Take 450 mg by mouth 2 (two) times daily.   docusate sodium 100 MG capsule Commonly known as:  COLACE Take 100 mg by mouth at bedtime. Hold for loose bowel movement   ferrous sulfate 325 (65 FE) MG tablet Take 325 mg by mouth daily with breakfast.   furosemide 20 MG tablet Commonly known as:  LASIX Take 20 mg by mouth every Monday, Wednesday, and Friday.   gabapentin 100 MG capsule Commonly known as:  NEURONTIN Take 1 capsule (100 mg total) by mouth 3 (three) times daily. What changed:    when to take this  additional instructions  Another medication with the same name was removed. Continue taking this medication, and follow the directions you see here.   ipratropium-albuterol  0.5-2.5 (3) MG/3ML Soln Commonly known as:  DUONEB Take 3 mLs by nebulization every 8 (eight) hours.   OLANZapine 5 MG tablet Commonly known as:  ZYPREXA Take 1 tablet (5 mg total) by mouth at bedtime. What changed:    medication strength  how much to take  additional instructions  Another medication with the same name was removed. Continue taking this  medication, and follow the directions you see here.   polyethylene glycol packet Commonly known as:  MIRALAX / GLYCOLAX Take 17 g by mouth 2 (two) times daily. Hold for loose stool   potassium chloride 10 MEQ CR capsule Commonly known as:  MICRO-K Take 10 mEq by mouth every Monday, Wednesday, and Friday.   senna 8.6 MG Tabs tablet Commonly known as:  SENOKOT Take 1 tablet by mouth 2 (two) times daily.   TIGER BALM MUSCLE RUB 05-23-13 % Crea Generic drug:  Camphor-Menthol-Methyl Sal Apply 1 application topically 3 (three) times daily as needed (to lower back three times daily).   vitamin B-12 500 MCG tablet Commonly known as:  CYANOCOBALAMIN Take 1,000 mcg by mouth daily.   Vitamin D3 50000 units Tabs Take 50,000 Units by mouth once a week.        Discharge Condition:  Overall prognosis guarded    Consults:   Palliative care Psychiatry    Significant Diagnostic Studies:  Dg Chest 2 View  Result Date: 05/08/2017 CLINICAL DATA:  Shortness of breath, hypoxemia. EXAM: CHEST  2 VIEW COMPARISON:  CT scan of December 21, 2011. Radiographs of Aug 05, 2008. FINDINGS: Moderate cardiomegaly is noted. No pneumothorax is noted. Minimal right basilar subsegmental atelectasis is noted. Left basilar opacity is noted which may represent atelectasis or infiltrate with associated pleural effusion. Old left rib fractures are noted. Degenerative changes are seen involving both shoulders. IMPRESSION: Moderate cardiomegaly. Left basilar opacity is noted concerning for atelectasis or infiltrate with associated pleural effusion. Electronically Signed   By: Marijo Conception, M.D.   On: 05/08/2017 13:41   Ct Head Wo Contrast  Result Date: 05/10/2017 CLINICAL DATA:  Altered level of consciousness EXAM: CT HEAD WITHOUT CONTRAST TECHNIQUE: Contiguous axial images were obtained from the base of the skull through the vertex without intravenous contrast. COMPARISON:  08/28/2007 FINDINGS: Brain: Diffuse  atrophic changes are again identified. Changes of mild chronic white matter ischemic change are seen. Some encephalomalacia changes are noted in the left frontal lobe similar to that seen on the prior exam. Vascular: No hyperdense vessel or unexpected calcification. Skull: Defect is noted in the left frontal lobe with associated metallic density likely related to prior surgery. This is stable in appearance. Sinuses/Orbits: Mucosal thickening is noted within the ethmoid sinus on the left. The orbits are within normal limits. Other: None IMPRESSION: Chronic changes as described above. No significant interval change is noted. Electronically Signed   By: Inez Catalina M.D.   On: 05/10/2017 10:05   Ct Chest Wo Contrast  Result Date: 05/12/2017 CLINICAL DATA:  Chest pain shortness of breath EXAM: CT CHEST WITHOUT CONTRAST TECHNIQUE: Multidetector CT imaging of the chest was performed following the standard protocol without IV contrast. COMPARISON:  Chest x-ray 05/11/2017, CT chest 12/21/2011 FINDINGS: Cardiovascular: Limited evaluation without intravenous contrast. Mild aortic atherosclerotic calcification. Ectatic ascending aorta measuring up to 3.9 cm. Dilated pulmonary trunk, measuring 4.5 cm. Coronary vessel calcification. Mild cardiomegaly. Small moderate pericardial effusion, measuring up to 17 mm in thickness on the right side. Mediastinum/Nodes: Midline trachea. No thyroid mass. Slightly  enlarged mediastinal lymph nodes. Right paratracheal lymph node measures 11 mm, right low paratracheal lymph node measures 9 mm. Esophagus within normal limits Lungs/Pleura: Small left greater than right pleural effusion. Bilateral lower lobe and lingular partial consolidations with air bronchograms. No pneumothorax. Upper Abdomen: No acute abnormality. Musculoskeletal: Scoliosis of the spine.  Vertebral hemangioma at L1 IMPRESSION: 1. Small right greater than left pleural effusions. Partial consolidations in the bilateral  lower lobes and lingula, suspicious for pneumonia. 2. Small moderate pericardial effusion, measuring up to 17 mm in maximum thickness on the right side 3. Dilated pulmonary trunk, suggestive of pulmonary artery hypertension 4. Small mediastinal lymph nodes, likely reactive Aortic Atherosclerosis (ICD10-I70.0). Electronically Signed   By: Donavan Foil M.D.   On: 05/12/2017 19:29   Dg Chest Port 1 View  Result Date: 05/16/2017 CLINICAL DATA:  Shortness of Breath EXAM: PORTABLE CHEST 1 VIEW COMPARISON:  May 14, 2017 FINDINGS: There is airspace consolidation throughout the left lower lobe with left pleural effusion. There is atelectatic change in the left mid lung. Right lung is clear. There is cardiomegaly with pulmonary vascularity within normal limits. No adenopathy. There is aortic atherosclerosis. Bones are osteoporotic. There are old healed rib fractures on the left. IMPRESSION: Left lower lobe consolidation with left pleural effusion, stable. Atelectasis left mid lung. Right lung clear. Stable cardiomegaly. There is aortic atherosclerosis. Aortic Atherosclerosis (ICD10-I70.0). Electronically Signed   By: Lowella Grip III M.D.   On: 05/16/2017 09:05   Dg Chest Port 1 View  Result Date: 05/14/2017 CLINICAL DATA:  Shortness of breath. EXAM: PORTABLE CHEST 1 VIEW COMPARISON:  One-view chest x-ray 05/11/2017 FINDINGS: Heart is enlarged. Atherosclerotic calcifications are again noted at the aortic arch. Aeration is improved since the prior study. The right lung base is clear. There is no edema or definite effusion. Degenerative changes are noted at the shoulders. IMPRESSION: 1. Stable cardiomegaly. 2. Improved aeration. Electronically Signed   By: San Morelle M.D.   On: 05/14/2017 19:07   Dg Chest Port 1 View  Result Date: 05/11/2017 CLINICAL DATA:  Aspiration, cough EXAM: PORTABLE CHEST 1 VIEW COMPARISON:  05/18/2017, 05/08/2017, CT chest 12/21/2011 FINDINGS: No significant interval  change in cardiomegaly and vascular congestion. Continued left greater than right pleural effusion. Consolidation at the lingula and left base. Hazy atelectasis or infiltrate at the right base. Aortic atherosclerosis. No pneumothorax. Old left-sided rib fractures. IMPRESSION: 1. Continued cardiomegaly with vascular congestion. 2. No change in left greater than right bibasilar airspace disease. Left greater than right pleural effusions. Electronically Signed   By: Donavan Foil M.D.   On: 05/11/2017 02:55   Dg Chest Port 1 View  Result Date: 05/10/2017 CLINICAL DATA:  Shortness of breath. EXAM: PORTABLE CHEST 1 VIEW COMPARISON:  Chest x-rays dated 05/08/2017 and 08/05/2008 and chest CT dated 12/21/2011 FINDINGS: There is persistent opacification of the left lung base. This could represent effusion or lung consolidation. I cannot exclude a mass. There is cardiomegaly. Prominence of the main pulmonary artery. Right lung is clear except for minimal linear atelectasis at the right base medially. Chronic thoracolumbar scoliosis. Arthritic changes of both shoulders. Old healed left rib fractures. Aortic atherosclerosis. IMPRESSION: 1. Persistent opacification of the left lung base which could represent infiltrate, effusion, or mass. 2. Cardiomegaly with enlargement of the main pulmonary artery. Electronically Signed   By: Lorriane Shire M.D.   On: 05/10/2017 09:18    echocardiogram,  completed 2/27 results pending at the time of discharge  Filed Weights   05/09/17 0129 05/12/17 0349 05/14/17 0033  Weight: 76.2 kg (167 lb 15.9 oz) 79.4 kg (175 lb 0.7 oz) 76.4 kg (168 lb 6.9 oz)     Microbiology: Recent Results (from the past 240 hour(s))  Blood Culture (routine x 2)     Status: None   Collection Time: 05/08/17  2:25 PM  Result Value Ref Range Status   Specimen Description BLOOD RIGHT ARM  Final   Special Requests   Final    BOTTLES DRAWN AEROBIC AND ANAEROBIC Blood Culture adequate volume    Culture   Final    NO GROWTH 5 DAYS Performed at [facility], [address] [place], [place] [address]    Report Status 05/13/2017 FINAL  Final  MRSA PCR Screening     Status: None   Collection Time: 05/15/17 12:16 PM  Result Value Ref Range Status   MRSA by PCR NEGATIVE NEGATIVE Final    Comment:        The GeneXpert MRSA Assay (FDA approved for NASAL specimens only), is one component of a comprehensive MRSA colonization surveillance program. It is not intended to diagnose MRSA infection nor to guide or monitor treatment for MRSA infections. Performed at [facility], [place] [address] [place], [place] [address]        Blood Culture    Component Value Date/Time   SDES BLOOD RIGHT ARM 05/08/2017 1425   SPECREQUEST  05/08/2017 1425    BOTTLES DRAWN AEROBIC AND ANAEROBIC Blood Culture adequate volume   CULT  05/08/2017 1425    NO GROWTH 5 DAYS Performed at [facility], [place] [address], [place], [place] [address]    REPTSTATUS 05/13/2017 FINAL 05/08/2017 1425      Labs: Results for orders placed or performed during the hospital encounter of 05/08/17 (from the past 48 hour(s))  Procalcitonin - Baseline     Status: None   Collection Time: 05/15/17 10:12 AM  Result Value Ref Range   Procalcitonin <0.10 ng/mL    Comment:        Interpretation: PCT (Procalcitonin) <= 0.5 ng/mL: Systemic infection (sepsis) is not likely. Local bacterial infection is possible. (NOTE)       Sepsis PCT Algorithm           Lower Respiratory Tract                                      Infection PCT Algorithm    ----------------------------     ----------------------------         PCT < 0.25 ng/mL                PCT < 0.10 ng/mL         Strongly encourage             Strongly discourage   discontinuation of antibiotics    initiation of antibiotics    ----------------------------     -----------------------------       PCT 0.25 - 0.50 ng/mL            PCT 0.10 - 0.25  ng/mL               OR       >80% decrease in PCT            Discourage initiation of  antibiotics      Encourage discontinuation           of antibiotics    ----------------------------     -----------------------------         PCT >= 0.50 ng/mL              PCT 0.26 - 0.50 ng/mL               AND        <80% decrease in PCT             Encourage initiation of                                             antibiotics       Encourage continuation           of antibiotics    ----------------------------     -----------------------------        PCT >= 0.50 ng/mL                  PCT > 0.50 ng/mL               AND         increase in PCT                  Strongly encourage                                      initiation of antibiotics    Strongly encourage escalation           of antibiotics                                     -----------------------------                                           PCT <= 0.25 ng/mL                                                 OR                                        > 80% decrease in PCT                                     Discontinue / Do not initiate                                             antibiotics Performed at [facility], [place] [address] [place], [place] [address]   MRSA PCR Screening     Status: None   Collection Time: 05/15/17  12:16 PM  Result Value Ref Range   MRSA by PCR NEGATIVE NEGATIVE    Comment:        The GeneXpert MRSA Assay (FDA approved for NASAL specimens only), is one component of a comprehensive MRSA colonization surveillance program. It is not intended to diagnose MRSA infection nor to guide or monitor treatment for MRSA infections. Performed at [facility], [place] [address], [place], [place] [address]   Comprehensive metabolic panel     Status: Abnormal   Collection Time: 05/16/17  8:07 AM  Result Value Ref Range   Sodium 139 135 - 145 mmol/L    Potassium 3.6 3.5 - 5.1 mmol/L   Chloride 95 (L) 101 - 111 mmol/L   CO2 33 (H) 22 - 32 mmol/L   Glucose, Bld 111 (H) 65 - 99 mg/dL   BUN 11 6 - 20 mg/dL   Creatinine, Ser 0.42 (L) 0.44 - 1.00 mg/dL   Calcium 8.4 (L) 8.9 - 10.3 mg/dL   Total Protein 6.8 6.5 - 8.1 g/dL   Albumin 2.2 (L) 3.5 - 5.0 g/dL   AST 12 (L) 15 - 41 U/L   ALT 8 (L) 14 - 54 U/L   Alkaline Phosphatase 67 38 - 126 U/L   Total Bilirubin 0.6 0.3 - 1.2 mg/dL   GFR calc non Af Amer >60 >60 mL/min   GFR calc Af Amer >60 >60 mL/min    Comment: (NOTE) The eGFR has been calculated using the CKD EPI equation. This calculation has not been validated in all clinical situations. eGFR's persistently <60 mL/min signify possible Chronic Kidney Disease.    Anion gap 11 5 - 15    Comment: Performed at [facility] [address], [place], [place] [address]  Basic metabolic panel     Status: Abnormal   Collection Time: 05/17/17  7:02 AM  Result Value Ref Range   Sodium 139 135 - 145 mmol/L   Potassium 3.9 3.5 - 5.1 mmol/L   Chloride 96 (L) 101 - 111 mmol/L   CO2 35 (H) 22 - 32 mmol/L   Glucose, Bld 105 (H) 65 - 99 mg/dL   BUN 9 6 - 20 mg/dL   Creatinine, Ser 0.42 (L) 0.44 - 1.00 mg/dL   Calcium 8.4 (L) 8.9 - 10.3 mg/dL   GFR calc non Af Amer >60 >60 mL/min   GFR calc Af Amer >60 >60 mL/min    Comment: (NOTE) The eGFR has been calculated using the CKD EPI equation. This calculation has not been validated in all clinical situations. eGFR's persistently <60 mL/min signify possible Chronic Kidney Disease.    Anion gap 8 5 - 15    Comment: Performed at [facility] [address], [place], [place] [address]     Lipid Panel     Component Value Date/Time   CHOL 182 09/10/2007 1127   TRIG 107 09/10/2007 1127   HDL 38.7 (L) 09/10/2007 1127   CHOLHDL 4.7 CALC 09/10/2007 1127   VLDL 21 09/10/2007 1127   LDLCALC 122 (H) 09/10/2007 1127     No results found for: HGBA1C   Lab Results  Component Value  Date   LDLCALC 122 (H) 09/10/2007   CREATININE 0.42 (L) 05/17/2017     HPI :  [name] is a 84 y.o. 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   At baseline patient is using supplemental oxygen 2 L/min [place], but due to worsening of shortness of breath was increased to 4 L, was found to have acute on chronic hypoxic respiratory failure due to pleural effusion/pneumonia.  HOSPITAL COURSE:    1. Acute on chronic combined hypercapnic and hypoxic respiratory failure. Left-sided pleural effusion. Suspected healthcare associated pneumonia. Acute on chronic diastolic CHF. Patient presented with complaints of shortness of breath as well as hypoxia. Uses 2 L of oxygen at baseline.  has required anywhere from 5-7 L this hospitalization Initial chest x-ray showed opacification of the left lung base, CT chest on 2/22 showed right greater than left pleural effusions with partial consolidation in bilateral lower lobe suspicious for pneumonia. Patient also found to have pericardial effusion up to 17 mm. Patient refused 2-D   to be evaluated for pulmonary hypertension, she also refused thoracentesis. 2-D echo done on 2/27 no results are pending. Will likely not change her clinical course even if she has a pericardial effusion that needs pericardiocentesis as the patient has been refusing all procedures. Son is aware Patient also deemed to have a component of CHF exacerbation with difficulty using diuretics due to low blood pressure  ABG on 2/22 showed pH of 7.38, PCO2 of 67 improved from 108 on  2/20 Patient found to have concomitant aspiration and is currently on a modified diet Completed treatment for pneumonia with vancomycin and cefepime from 2/18-2/25. Repeat pro-calcitonin less than 0.10 Given shortness of breath again today, attempted to give  Lasix 40 mg IV 1, but unable due to low BP , encourage family to meet with palliative care for goals of care. Now DO NOT RESUSCITATE and focus is  comfort  2.  Severe dystonia. Dysphagia. Patient has severe dystonia and does get Botox injections on a regular basis. Did not get any injections in last 6 months.  Generally her duration is every 3 months. Dysphagia 1 diet with nectar thick liquids recommended by speech therapy Patient remains at high risk for aspiration.  Family aware.    3.  Acute  Metabolic Encephalopathy. Acute on chronic hypercarbic respiratory failure. Patient become minimally responsive on 05/10/2017 in the morning. The night prior to that patient received her regular routine medications. It appears that the patient is unable to tolerate her home regimen. ABG was showing severe hypercarbia, patient was placed on BiPAP, repeat ABG shows chronic compensated hypercarbic respiratory failure. Encephalopathy is likely multifactorial.    4.  HTN. Patient is on Inderal, held due to hypotension medications  .   5.Anemia of chronic disease. H&H stable.  Monitor.  6.Anxiety. Bipolar disorder,Schizophrenia She has history of chronic paranoia with prior suicide attempts, currently denies suicidal ideation Seen by psychiatry on 2/21, medications adjusted  Continue Zyprexa 5 mg daily at bedtime for psychosis As needed  Xanax for anxiety     7.  Pulmonary hypertension. This likely secondary to pulmonary disease. Patient has chronic combined hypercapnic and hypoxemic respiratory failure.     9. Hypokalemia: repleted. Future labs based on family request       Discharge Exam:   Blood pressure (!) 87/52,  pulse 90, temperature 98.7 F (37.1 C), temperature source Axillary, resp. rate 17, height '5\' 3"'$  (1.6 m), weight 76.4 kg (  168 lb 6.9 oz), SpO2 94 %.   General: Awake and alert.  Not in any distress. Eyes: PERRL, Conjunctiva normal ENT: Oral Mucosa clear moist. Neck:  Can not assess JVD Cardiovascular: S1 and S2. ESM.   Respiratory: Decreased air entry, increased accessory muscle use   Abdomen: Obese, soft and nontender.  Organs are difficult to assess.  Bowel sounds are present.       Contact information for after-discharge care    Destination    [facility] .   Service:  Skilled Nursing Contact information: [place] [phone]              Signed: Reyne Dumas 05/17/2017, 9:15 AM        Time spent >1 hour

## 2017-05-17 NOTE — Progress Notes (Signed)
  Echocardiogram 2D Echocardiogram has been performed.  [name] 05/17/2017, 8:57 AM

## 2017-05-17 NOTE — Progress Notes (Signed)
Lamar Blinks, NP notified of BP after bolus administered.

## 2017-06-19 DEATH — deceased

## 2017-06-20 DIAGNOSIS — R0602 Shortness of breath: Secondary | ICD-10-CM
